# Patient Record
Sex: Male | Born: 1960 | Race: White | Hispanic: No | Marital: Married | Smoking: Never smoker
Health system: Southern US, Community
[De-identification: ages and names within clinical notes are randomized; demographics above are authoritative.]

## PROBLEM LIST (undated history)

## (undated) DIAGNOSIS — I4891 Unspecified atrial fibrillation: Secondary | ICD-10-CM

## (undated) DIAGNOSIS — M25461 Effusion, right knee: Secondary | ICD-10-CM

## (undated) DIAGNOSIS — L03115 Cellulitis of right lower limb: Secondary | ICD-10-CM

## (undated) DIAGNOSIS — M545 Low back pain, unspecified: Secondary | ICD-10-CM

## (undated) HISTORY — DX: Cellulitis of right lower limb: L03.115

## (undated) HISTORY — DX: Unspecified atrial fibrillation: I48.91

## (undated) HISTORY — DX: Low back pain, unspecified: M54.50

## (undated) HISTORY — PX: SHOULDER SURGERY: SHX246

## (undated) HISTORY — DX: Effusion, right knee: M25.461

---

## 2011-05-27 ENCOUNTER — Encounter (HOSPITAL_COMMUNITY): Payer: Self-pay | Admitting: *Deleted

## 2011-05-27 ENCOUNTER — Inpatient Hospital Stay (HOSPITAL_COMMUNITY)
Admission: EM | Admit: 2011-05-27 | Discharge: 2011-05-29 | DRG: 273 | Disposition: A | Discharge status: Home / Self Care | Payer: BC Managed Care – PPO | Source: Ambulatory Visit | Attending: Family Medicine | Admitting: Family Medicine

## 2011-05-27 DIAGNOSIS — L03211 Cellulitis of face: Secondary | ICD-10-CM

## 2011-05-27 DIAGNOSIS — B029 Zoster without complications: Principal | ICD-10-CM | POA: Diagnosis present

## 2011-05-27 LAB — DIFFERENTIAL
Basophils Absolute: 0.1 10*3/uL (ref 0.0–0.1)
Basophils Relative: 1 % (ref 0–1)
Eosinophils Absolute: 0.1 10*3/uL (ref 0.0–0.7)
Eosinophils Relative: 1 % (ref 0–5)
Lymphocytes Relative: 13 % (ref 12–46)
Lymphs Abs: 0.7 10*3/uL (ref 0.7–4.0)
Monocytes Absolute: 1.4 10*3/uL — ABNORMAL HIGH (ref 0.1–1.0)
Monocytes Relative: 25 % — ABNORMAL HIGH (ref 3–12)
Neutro Abs: 3.4 10*3/uL (ref 1.7–7.7)
Neutrophils Relative %: 61 % (ref 43–77)

## 2011-05-27 LAB — BASIC METABOLIC PANEL
BUN: 10 mg/dL (ref 6–23)
CO2: 24 mEq/L (ref 19–32)
Calcium: 9.5 mg/dL (ref 8.4–10.5)
Chloride: 104 mEq/L (ref 96–112)
Creatinine, Ser: 0.91 mg/dL (ref 0.50–1.35)
GFR calc Af Amer: >90 mL/min (ref >90)
GFR calc non Af Amer: >90 mL/min (ref >90)
Glucose, Bld: 97 mg/dL (ref 70–99)
Potassium: 4.1 mEq/L (ref 3.5–5.1)
Sodium: 139 mEq/L (ref 135–145)

## 2011-05-27 LAB — CBC
HCT: 43.3 % (ref 39.0–52.0)
Hemoglobin: 14.9 g/dL (ref 13.0–17.0)
MCH: 29.3 pg (ref 26.0–34.0)
MCHC: 34.4 g/dL (ref 30.0–36.0)
MCV: 85.2 fL (ref 78.0–100.0)
Platelets: 141 10*3/uL — ABNORMAL LOW (ref 150–400)
RBC: 5.08 MIL/uL (ref 4.22–5.81)
RDW: 12.7 % (ref 11.5–15.5)
WBC: 5.5 10*3/uL (ref 4.0–10.5)

## 2011-05-27 MED ORDER — MORPHINE SULFATE 4 MG/ML IJ SOLN
4.0000 mg | Freq: Once | INTRAMUSCULAR | Status: AC
  Administered 2011-05-27: 4 mg via INTRAVENOUS
  Filled 2011-05-27: qty 1

## 2011-05-27 MED ORDER — ACETAMINOPHEN 325 MG PO TABS
650.0000 mg | ORAL_TABLET | Freq: Once | ORAL | Status: AC
  Administered 2011-05-27: 650 mg via ORAL

## 2011-05-27 MED ORDER — IBUPROFEN 200 MG PO TABS
400.0000 mg | ORAL_TABLET | Freq: Once | ORAL | Status: AC
  Administered 2011-05-28: 400 mg via ORAL
  Filled 2011-05-27: qty 2

## 2011-05-27 MED ORDER — CLINDAMYCIN PHOSPHATE 900 MG/50ML IV SOLN
900.0000 mg | Freq: Three times a day (TID) | INTRAVENOUS | Status: DC
  Administered 2011-05-27 (×2): 900 mg via INTRAVENOUS
  Filled 2011-05-27 (×6): qty 50

## 2011-05-27 MED ORDER — SODIUM CHLORIDE 0.9 % IV SOLN
999.0000 mL | Freq: Once | INTRAVENOUS | Status: AC
  Administered 2011-05-27 (×2): 999 mL via INTRAVENOUS

## 2011-05-27 MED ORDER — ACETAMINOPHEN 325 MG PO TABS
ORAL_TABLET | ORAL | Status: AC
  Administered 2011-05-27: 23:00:00
  Filled 2011-05-27: qty 2

## 2011-05-27 NOTE — Progress Notes (Signed)
ANTIBIOTIC CONSULT NOTE - INITIAL  Pharmacy Consult for Vancomycin and Acyclovir Indication: Facial swelling poss zoster or cellulitis  No Known Allergies  Patient Measurements:  75 inches 235 lbs  Vital Signs: Temp: 99.7 F (37.6 C) (01/13 2036) Temp src: Oral (01/13 2036) BP: 151/81 mmHg (01/13 2036) Pulse Rate: 87  (01/13 2036)  Labs:  Basename 05/27/11 0902  WBC 5.5  HGB 14.9  PLT 141*  LABCREA --  CREATININE 0.91   CrCl is unknown because there is no height on file for the current visit. No results found for this basename: VANCOTROUGH:2,VANCOPEAK:2,VANCORANDOM:2,GENTTROUGH:2,GENTPEAK:2,GENTRANDOM:2,TOBRATROUGH:2,TOBRAPEAK:2,TOBRARND:2,AMIKACINPEAK:2,AMIKACINTROU:2,AMIKACIN:2, in the last 72 hours   Medical History: No past medical history on file.  Medications:  None  Assessment: 51 yo male with facial swelling for empiric antibiotics   Goal of Therapy:  Vancomycin trough 10-15  Plan:  Vancomycin 2 g IV now, then 1 g IV q8h Acyclovir 800 mg IV q8h  Eddie Candle 05/28/2011,12:01 AM

## 2011-05-27 NOTE — ED Notes (Signed)
Meal tray ordered from service response center 

## 2011-05-27 NOTE — ED Notes (Signed)
Updated the RN on 5100 and patient is now being transferred to 5123.

## 2011-05-27 NOTE — ED Notes (Signed)
Waiting for admission orders.  Called report to [phone]

## 2011-05-27 NOTE — ED Provider Notes (Signed)
Patient report received from [place], PA-C. 51 year old male presents with cellulitis on left face. No ocular involvement. Patient is in CDU under cellulitis protocol. First sets of clindamycin IV has finished. We'll start the second set of clindamycin IV around 5 PM. Anticipate discharge patient after IV antibiotic. Patient to continue previously prescribed antibiotic at home. Instructions for strict followup given.  Patient is currently in no acute distress. On evaluation, erythema noted to left face occupying approximately 2/3 of zygomatic region.  On the color crust noted.  No obvious abscess noted. Infraorbital edema noted on the left eye with no ocular involvement.  Morphine given for pain. I will continue to monitor patient care. Pt goes to [facility].    3:19 PM Sign out to [facility], PA-C  Grant Knight, [place] 05/27/11 1519

## 2011-05-27 NOTE — ED Provider Notes (Signed)
4:00 PM Patient is in CDU under observation, cellulitis protocol.  Patient with cellulitis to left face, involving left upper lip, left cheek up to the level of his lower eyelid.  Patient states his family thinks the swelling has decreased but he is unsure, feels that it is getting better and worse depending on his positioning and activity.  Denies any change in vision or pain with moving his eyes.  Declines pain medication at this time.  2nd dose of clindamycin scheduled for 5:30pm.  Will continue to follow.  On exam, pt is A&Ox4, NAD, left face with area of erythema, central induration with crusting.  EOMs intact, moving without pain.    8:00pm  Patient reevaluated by me and by Dr Ignacia Palma.  Patient is showing some improvement across his cheek but worsening towards his eye and lip.  Given worsening in the direction of the eye and question of zoster, plan is for admission.   8:48 PM Discussed patient with Dr Kaylyn Layer who will see and admit patient.  Discussed cellulitis vs herpes zoster.  Will admit for further evaluation and treatment.    Dillard Cannon [place], [place] 05/27/11 2049

## 2011-05-27 NOTE — ED Notes (Signed)
Ordered Regular Diet tray 

## 2011-05-27 NOTE — ED Provider Notes (Signed)
History     CSN: [id number]  Arrival date & time 05/27/11  0825   First MD Initiated Contact with Patient 05/27/11 [id number]      Chief Complaint  Patient presents with  . Facial Swelling    (Consider location/radiation/quality/duration/timing/severity/associated sxs/prior treatment) HPI Patient presents the emergency room with four-day history of facial swelling and redness to the left cheek and upper left lip area.  He states that he got worse yesterday and seen at an urgent care center.  He was given Rocephin IM and then placed on Bactrim DS along with Bactroban.  He was advised to come to the emergency department for any worsening in his condition.  He states that he noticed there is redness and swelling to his face and he noticed swelling under his left eye.  The patient denies fever, shortness of breath, chest pain, weakness, dysuria, nausea/vomiting, or abdominal pain.  Patient denies any difficulty swallowing or breathing.  He states he thought it was a  tooth initially but does not have any dental pain at this time. No past medical history on file.  No past surgical history on file.  No family history on file.  History  Substance Use Topics  . Smoking status: Never Smoker   . Smokeless tobacco: Not on file  . Alcohol Use: Yes      Review of Systems All pertinent positives and negatives reviewed in the history of present illness  Allergies  Review of patient's allergies indicates no known allergies.  Home Medications   Current Outpatient Rx  Name Route Sig Dispense Refill  . ACETAMINOPHEN 325 MG PO TABS Oral Take 325 mg by mouth every 6 (six) hours as needed. For pain.    . ASPIRIN 325 MG PO TABS Oral Take 325 mg by mouth as needed. For pain.    . IBUPROFEN 200 MG PO TABS Oral Take 200 mg by mouth every 8 (eight) hours as needed. For pain.    Marland Kitchen MUPIROCIN CALCIUM 2 % EX CREA Topical Apply 1 application topically 3 (three) times daily.    . SULFAMETHOXAZOLE-TMP DS  800-160 MG PO TABS Oral Take 1 tablet by mouth 2 (two) times daily.      BP 118/68  Pulse 74  Temp(Src) 98.6 F (37 C) (Oral)  Resp 20  SpO2 99%  Physical Exam  Constitutional: He is oriented to person, place, and time. He appears well-developed and well-nourished. No distress.  HENT:  Head: Normocephalic and atraumatic.  Eyes: Pupils are equal, round, and reactive to light.  Neck: Neck supple.  Cardiovascular: Normal rate, regular rhythm and normal heart sounds.   Pulmonary/Chest: Effort normal and breath sounds normal. No respiratory distress. He has no wheezes. He has no rales.  Neurological: He is alert and oriented to person, place, and time.  Skin:       Patient has redness and warmth to his left cheek with several areas that are scabbed over on his upper lip left lip area and cheek.  He does have some edema under his left eye but no pain with eye movements and the area is not firm and hot to the touch    ED Course  Procedures (including critical care time)  Labs Reviewed  CBC - Abnormal; Notable for the following:    Platelets 141 (*)    All other components within normal limits  DIFFERENTIAL - Abnormal; Notable for the following:    Monocytes Relative 25 (*)    Monocytes Absolute 1.4 (*)  All other components within normal limits  BASIC METABOLIC PANEL          MDM  Cellulitis of the left cheek.        Carlyle Dolly, PA-C 05/27/11 1443

## 2011-05-27 NOTE — ED Notes (Signed)
Clindamycin has finished infusing.

## 2011-05-27 NOTE — H&P (Signed)
PCP:  Sheila Oats, MD    Not entirely clear who exactly but is with Deboraha Sprang at  [facility] Complaint:  Facial redness, swelling, pain   HPI: 50yoM with no major medical history presents with left facial swelling and ulcerated  lesions concerning for facial zoster   Pt woke up Thursday am and noted pain in his upper lip for which he tried to see a  dentist thinking it was his teeth, but was unable to make an appt. By mid Friday he  noted swelling and redness of his left cheek, for which he sought care at Urgent Care  on Saturday, where he was given a shot of Rocephin, and sent out with Bactrim and  topical ABx. Saturday night his wife noted it to be worse, and by Sunday am his eyelid  had "puffed up" considerably, so he came to the ED.   In the ED, vitals were stable. Labs showed normal chemistry and CBC. Pt was treated in  the CDU through most of the day on 11/13, given Clinda IV x2 given here. However, he  was not felt to be improving, and in fact felt to be getting worse, with erythema  moving towards lip and eye. Then, there was some concern that this was zoster on his  face, but nobody in the ED was sure and he was not started on treatment for this.   When asked about the crusted lesions on his face, he states that these have been  painful and developed over the same time course. He is also having some pain on his  inner upper lip and above his teeth. He's never had this before. He denies any fevers,  chills, sweats, systemic illness. He denies any vision changes at all. He is not  immunocompromised. He denies any cardiopulmonary symptoms, or GI symptoms. All other  ROS negative.   No past medical history on file.  Healthy, no major medical problems  No past surgical history on file.  Medications:  HOME MEDS: Takes no medications on a regular basis  Prior to Admission medications   Medication Sig Start Date End Date Taking? Authorizing Provider    acetaminophen (TYLENOL) 325 MG tablet Take 325 mg by mouth every 6 (six) hours as needed. For pain.   Yes Historical Provider, MD  aspirin 325 MG tablet Take 325 mg by mouth as needed. For pain.   Yes Historical Provider, MD  ibuprofen (ADVIL,MOTRIN) 200 MG tablet Take 200 mg by mouth every 8 (eight) hours as needed. For pain.   Yes Historical Provider, MD  mupirocin cream (BACTROBAN) 2 % Apply 1 application topically 3 (three) times daily.   Yes Historical Provider, MD  sulfamethoxazole-trimethoprim (BACTRIM DS) 800-160 MG per tablet Take 1 tablet by mouth 2 (two) times daily.   Yes Historical Provider, MD    Allergies:  No Known Allergies  Social History:   reports that he has never smoked. He does not have any smokeless tobacco history on file. He reports that he drinks about 1.5 - 2 ounces of alcohol per week. He reports that he does not use illicit drugs. 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 Lung cancer Mother     deceased  . Diabetes Sister     deceased  . Diabetes Mother   . Breast  cancer Sister    Physical Exam: Filed Vitals:   05/27/11 0834 05/27/11 1245 05/27/11 1748 05/27/11 2036  BP: 111/69 118/68 122/78 151/81  Pulse: 71 74 74 87  Temp: 98.4 F (36.9 C) 98.6 F (37 C) 98.2 F (36.8 C) 99.7 F (37.6 C)  TempSrc: Oral Oral Oral Oral  Resp: 20   18  SpO2: 99% 99% 98% 98%   Blood pressure 151/81, pulse 87, temperature 99.7 F (37.6 C), temperature source Oral, resp. rate 18, SpO2 98.00%.  Gen: Healthy, middle aged appearing M in no distress, pleasant. Able to relate history,  appears well, non-toxic.  HEENT: Pupils round and reactive to light. Sclera have right > left moderately severe  conjuctivitis, but no other obvious keratitis or other corneal lesions, EOMI. His left  cheek from the left upper lip  extending upwards almost towards lower left eyelid is  erythematous with poorly defined borders, and quite swollen, with his left lower eyelid  very puffy and swollen. On top of this erythema adn swelling are several discrete,  round crusted lesions, largest is ~1cm, with black and yellowish crusting. Moving  superiorly, there is a smaller dry ulcerated lesion to the left of his eye, and most  concerning is that on the inner border of his lower eyelid, one finds a very small 1- 2mm ulceration. In his mouth, on the superior lip on the left, there are 1 maybe 2  small ulcerations as well, but nothing larger or more serious, rest of mouth  unremarkable.  Lungs: CTAB no w/c/r normal exam Heart: RRR, no m/g, normal exam Abd: OVerweight but soft NT ND benign Extrem: Warm, well perfusing, normal appearing in bulk and tone, no BLE edema Neuro: Alert, conversant, pleasant, moving extremities, grossly non-focal   Labs & Imaging Results for orders placed during the hospital encounter of 05/27/11 (from the past 48 hour(s))  CBC     Status: Abnormal   Collection Time   05/27/11  9:02 AM      Component Value Range Comment   WBC 5.5  4.0 - 10.5 (K/uL)    RBC 5.08  4.22 - 5.81 (MIL/uL)    Hemoglobin 14.9  13.0 - 17.0 (g/dL)    HCT 16.1  09.6 - 04.5 (%)    MCV 85.2  78.0 - 100.0 (fL)    MCH 29.3  26.0 - 34.0 (pg)    MCHC 34.4  30.0 - 36.0 (g/dL)    RDW 40.9  81.1 - 91.4 (%)    Platelets 141 (*) 150 - 400 (K/uL)   DIFFERENTIAL     Status: Abnormal   Collection Time   05/27/11  9:02 AM      Component Value Range Comment   Neutrophils Relative 61  43 - 77 (%)    Neutro Abs 3.4  1.7 - 7.7 (K/uL)    Lymphocytes Relative 13  12 - 46 (%)    Lymphs Abs 0.7  0.7 - 4.0 (K/uL)    Monocytes Relative 25 (*) 3 - 12 (%)    Monocytes Absolute 1.4 (*) 0.1 - 1.0 (K/uL)    Eosinophils Relative 1  0 - 5 (%)    Eosinophils Absolute 0.1  0.0 - 0.7 (K/uL)    Basophils Relative 1  0 - 1 (%)    Basophils Absolute 0.1   0.0 - 0.1 (K/uL)   BASIC METABOLIC PANEL     Status: Normal   Collection Time   05/27/11  9:02 AM  Component Value Range Comment   Sodium 139  135 - 145 (mEq/L)    Potassium 4.1  3.5 - 5.1 (mEq/L)    Chloride 104  96 - 112 (mEq/L)    CO2 24  19 - 32 (mEq/L)    Glucose, Bld 97  70 - 99 (mg/dL)    BUN 10  6 - 23 (mg/dL)    Creatinine, Ser 9.60  0.50 - 1.35 (mg/dL)    Calcium 9.5  8.4 - 10.5 (mg/dL)    GFR calc non Af Amer >90  >90 (mL/min)    GFR calc Af Amer >90  >90 (mL/min)    No results found.  Impression Present on Admission:  .Herpes zoster  50yoM with no major medical history presents with left facial swelling and ulcerated  lesions concerning for facial zoster  1. 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 I cannot find any evidence of eye involvement at this time,  although I am concerned about an ulcerated lesion to the left of his eye, and one on  the border of his lower eyelid. No vision changes at this time.   - IV vancomycin for possible cellulitic component and also IV acyclovir for Zoster.  - Ophtho recommends that if eye becomes involved, would recommend Zirgan (ganciclovir)  eye drops, which I have called the pharmacy and this is in stock. I told pharmacy to  keep it handy for this patient should zoster ophthalmicus be diagnosed.  - Pain control   2. No other acute medical issues.   Regular bed, [facility] team 7 Presumed full code  Other plans as per orders.  Vonnie Spagnolo 05/27/2011, 11:09 PM

## 2011-05-27 NOTE — ED Notes (Addendum)
Went to urgent care yesterday, and told he had a staph infection to the lt. Side of face. Inc. Swelling (mod.) to left side of face. Lower eye lid mod. Swelling. Told to come to the ED to be evaluated if swelling gets close to eye. Give im rocephin injection, oral bactrim, and mupirocin ointment. No visual problems.

## 2011-05-28 ENCOUNTER — Encounter (HOSPITAL_COMMUNITY): Payer: Self-pay | Admitting: Ophthalmology

## 2011-05-28 LAB — BASIC METABOLIC PANEL
BUN: 8 mg/dL (ref 6–23)
CO2: 24 mEq/L (ref 19–32)
Calcium: 9.3 mg/dL (ref 8.4–10.5)
Chloride: 105 mEq/L (ref 96–112)
Creatinine, Ser: 0.82 mg/dL (ref 0.50–1.35)
GFR calc Af Amer: >90 mL/min (ref >90)
GFR calc non Af Amer: >90 mL/min (ref >90)
Glucose, Bld: 132 mg/dL — ABNORMAL HIGH (ref 70–99)
Potassium: 4 mEq/L (ref 3.5–5.1)
Sodium: 139 mEq/L (ref 135–145)

## 2011-05-28 LAB — CBC
HCT: 41 % (ref 39.0–52.0)
Hemoglobin: 14.2 g/dL (ref 13.0–17.0)
MCH: 29.3 pg (ref 26.0–34.0)
MCHC: 34.6 g/dL (ref 30.0–36.0)
MCV: 84.7 fL (ref 78.0–100.0)
Platelets: 151 10*3/uL (ref 150–400)
RBC: 4.84 MIL/uL (ref 4.22–5.81)
RDW: 12.8 % (ref 11.5–15.5)
WBC: 5.9 10*3/uL (ref 4.0–10.5)

## 2011-05-28 MED ORDER — ACETAMINOPHEN 325 MG PO TABS
650.0000 mg | ORAL_TABLET | Freq: Four times a day (QID) | ORAL | Status: DC | PRN
  Administered 2011-05-28 – 2011-05-29 (×4): 650 mg via ORAL
  Filled 2011-05-28 (×4): qty 2

## 2011-05-28 MED ORDER — OXYCODONE HCL 5 MG PO TABS
5.0000 mg | ORAL_TABLET | ORAL | Status: DC | PRN
  Administered 2011-05-28 (×3): 5 mg via ORAL
  Filled 2011-05-28 (×3): qty 1

## 2011-05-28 MED ORDER — DEXTROSE 5 % IV SOLN
800.0000 mg | INTRAVENOUS | Status: AC
  Administered 2011-05-28: 800 mg via INTRAVENOUS
  Filled 2011-05-28: qty 16

## 2011-05-28 MED ORDER — ONDANSETRON HCL 4 MG PO TABS: 4.0000 mg | ORAL_TABLET | Freq: Four times a day (QID) | ORAL | Status: DC | PRN

## 2011-05-28 MED ORDER — VANCOMYCIN HCL IN DEXTROSE 1-5 GM/200ML-% IV SOLN
1000.0000 mg | Freq: Three times a day (TID) | INTRAVENOUS | Status: DC
  Administered 2011-05-28 – 2011-05-29 (×4): 1000 mg via INTRAVENOUS
  Filled 2011-05-28 (×8): qty 200

## 2011-05-28 MED ORDER — DEXTROSE 5 % IV SOLN
800.0000 mg | Freq: Three times a day (TID) | INTRAVENOUS | Status: DC
  Administered 2011-05-28 – 2011-05-29 (×4): 800 mg via INTRAVENOUS
  Filled 2011-05-28 (×7): qty 16

## 2011-05-28 MED ORDER — DOCUSATE SODIUM 100 MG PO CAPS
100.0000 mg | ORAL_CAPSULE | Freq: Two times a day (BID) | ORAL | Status: DC
  Administered 2011-05-28 (×2): 100 mg via ORAL
  Filled 2011-05-28 (×3): qty 1

## 2011-05-28 MED ORDER — SENNA 8.6 MG PO TABS
1.0000 | ORAL_TABLET | Freq: Two times a day (BID) | ORAL | Status: DC
  Administered 2011-05-28 (×2): 8.6 mg via ORAL
  Filled 2011-05-28 (×4): qty 1

## 2011-05-28 MED ORDER — ONDANSETRON HCL 4 MG/2ML IJ SOLN: 4.0000 mg | Freq: Four times a day (QID) | INTRAMUSCULAR | Status: DC | PRN

## 2011-05-28 MED ORDER — MORPHINE SULFATE 2 MG/ML IJ SOLN
2.0000 mg | INTRAMUSCULAR | Status: DC | PRN
  Filled 2011-05-28: qty 1

## 2011-05-28 MED ORDER — VANCOMYCIN HCL 1000 MG IV SOLR
2000.0000 mg | INTRAVENOUS | Status: AC
  Administered 2011-05-28: 2000 mg via INTRAVENOUS
  Filled 2011-05-28: qty 2000

## 2011-05-28 MED ORDER — IBUPROFEN 400 MG PO TABS
400.0000 mg | ORAL_TABLET | Freq: Four times a day (QID) | ORAL | Status: DC | PRN
  Administered 2011-05-28 – 2011-05-29 (×2): 400 mg via ORAL
  Filled 2011-05-28 (×2): qty 1

## 2011-05-28 MED ORDER — ACETAMINOPHEN 650 MG RE SUPP: 650.0000 mg | Freq: Four times a day (QID) | RECTAL | Status: DC | PRN

## 2011-05-28 MED ORDER — INFLUENZA VIRUS VACC SPLIT PF IM SUSP
0.5000 mL | INTRAMUSCULAR | Status: DC
Start: 2011-05-29 — End: 2011-05-29
  Filled 2011-05-28: qty 0.5

## 2011-05-28 NOTE — Progress Notes (Signed)
Subjective: Pt reports erythema is receeding.  States that this all started with a discomfort at his left upper lip on Thursday.  On Friday he started noticing some "spots" on his face that were uncomfortable.  He states that he initially was touching it and scratching it some.  Edema became progressively worse and patient decided to go to urgent care.  States they gave his a shot and some antibiotics.  Swelling became worse and patient decided to follow-up at the at the ED.    Objective: Filed Vitals:   05/28/11 0000 05/28/11 0028 05/28/11 0222 05/28/11 0625  BP:  132/72 144/74 141/69  Pulse:  84 75 72  Temp:  98.8 F (37.1 C) 98.2 F (36.8 C) 97.8 F (36.6 C)  TempSrc:  Oral Oral   Resp:   18 18  Height: 6\' 3"  (1.905 m)     Weight: 106.595 kg (235 lb)     SpO2:  96% 98% 99%   Weight change:   Intake/Output Summary (Last 24 hours) at 05/28/11 0905 Last data filed at 05/27/11 1840  Gross per 24 hour  Intake      0 ml  Output      0 ml  Net      0 ml    General: Alert, awake, oriented x3, in no acute distress.  HEENT: Patient has multiple crusted lesions measuring .5 cm or less with surrounding erythema at left maxillary side of face.  Pain on palpation.  No ulceration at left eye.  Heart: Regular rate and rhythm, without murmurs, rubs, gallops.  Lungs: Crackles left side, bilateral air movement.  Abdomen: Soft, nontender, nondistended, positive bowel sounds.  Neuro: Grossly intact, nonfocal.   Lab Results:  [facility] 05/27/11 0902  NA 139  K 4.1  CL 104  CO2 24  GLUCOSE 97  BUN 10  CREATININE 0.91  CALCIUM 9.5  MG --  PHOS --   No results found for this basename: AST:2,ALT:2,ALKPHOS:2,BILITOT:2,PROT:2,ALBUMIN:2 in the last 72 hours No results found for this basename: LIPASE:2,AMYLASE:2 in the last 72 hours  Basename 05/27/11 0902  WBC 5.5  NEUTROABS 3.4  HGB 14.9  HCT 43.3  MCV 85.2  PLT 141*   No results found for this basename:  CKTOTAL:3,CKMB:3,CKMBINDEX:3,TROPONINI:3 in the last 72 hours No components found with this basename: POCBNP:3 No results found for this basename: DDIMER:2 in the last 72 hours No results found for this basename: HGBA1C:2 in the last 72 hours No results found for this basename: CHOL:2,HDL:2,LDLCALC:2,TRIG:2,CHOLHDL:2,LDLDIRECT:2 in the last 72 hours No results found for this basename: TSH,T4TOTAL,FREET3,T3FREE,THYROIDAB in the last 72 hours No results found for this basename: VITAMINB12:2,FOLATE:2,FERRITIN:2,TIBC:2,IRON:2,RETICCTPCT:2 in the last 72 hours  Micro Results: No results found for this or any previous visit (from the past 240 hour(s)).  Studies/Results: No results found.  Medications: I have reviewed the patient's current medications.   Patient Active Hospital Problem List: Herpes zoster (05/27/2011) 1) At this point patient is on Acyclovir.  Would consider switching to famciclovir on discharge as it can be administered 3 times a day instead of the 5 times a day for acyclovir and thus should facilitate patient compliance.  Agree that the lesions may have superimposed bacterial infection and agree with continuing antibiotic regimen.  Will follow-up with ophthalmologies recommendations given current clinical scenario.  Would also appreciate follow-up recommendations from their standpoint.     LOS: 1 day   Penny Pia M.D.  [facility] 05/28/2011, 9:05 AM

## 2011-05-28 NOTE — ED Notes (Signed)
Report given to [place]. Pt transported to 5114

## 2011-05-28 NOTE — Progress Notes (Signed)
05/28/2011 Grant Knight [facility] Case Management Note [phone]  Utilization review completed.  

## 2011-05-28 NOTE — Consult Note (Signed)
Reason for Consult:Patient was admitted for possible cellulitis left cheek.  The patient first noted swelling and pain left cheek, left upper lip and left lower lid with redness of the left eye. Was started on Vancomycin IV for possible cellulitis, and Acyclovir IV because of the possibility of Herpes Zoster Ophthalmicus.  Referring Physician: [name] is an 51 y.o. male.  HPI: Because of the crusting lesions of the left cheek, left lower lid and redness of the left eye Herpes Zoster Ophthalmicus is suspected in the V-II distribution. Consult to decide whether topical treatment of the left eye is necessary.   No past medical history on file.  No past surgical history on file.  Family History  Problem Relation Age of Onset  . Lung cancer Mother     deceased  . Diabetes Sister     deceased  . Diabetes Mother   . Breast cancer Sister     Social History:  reports that he has never smoked. He does not have any smokeless tobacco history on file. He reports that he drinks about 1.5 - 2 ounces of alcohol per week. He reports that he does not use illicit drugs.  Allergies: No Known Allergies  Medications: I have reviewed the patient's current medications.  Results for orders placed during the hospital encounter of 05/27/11 (from the past 48 hour(s))  CBC     Status: Abnormal   Collection Time   05/27/11  9:02 AM      Component Value Range Comment   WBC 5.5  4.0 - 10.5 (K/uL)    RBC 5.08  4.22 - 5.81 (MIL/uL)    Hemoglobin 14.9  13.0 - 17.0 (g/dL)    HCT 16.1  09.6 - 04.5 (%)    MCV 85.2  78.0 - 100.0 (fL)    MCH 29.3  26.0 - 34.0 (pg)    MCHC 34.4  30.0 - 36.0 (g/dL)    RDW 40.9  81.1 - 91.4 (%)    Platelets 141 (*) 150 - 400 (K/uL)   DIFFERENTIAL     Status: Abnormal   Collection Time   05/27/11  9:02 AM      Component Value Range Comment   Neutrophils Relative 61  43 - 77 (%)    Neutro Abs 3.4  1.7 - 7.7 (K/uL)    Lymphocytes Relative 13  12 - 46 (%)    Lymphs  Abs 0.7  0.7 - 4.0 (K/uL)    Monocytes Relative 25 (*) 3 - 12 (%)    Monocytes Absolute 1.4 (*) 0.1 - 1.0 (K/uL)    Eosinophils Relative 1  0 - 5 (%)    Eosinophils Absolute 0.1  0.0 - 0.7 (K/uL)    Basophils Relative 1  0 - 1 (%)    Basophils Absolute 0.1  0.0 - 0.1 (K/uL)   BASIC METABOLIC PANEL     Status: Normal   Collection Time   05/27/11  9:02 AM      Component Value Range Comment   Sodium 139  135 - 145 (mEq/L)    Potassium 4.1  3.5 - 5.1 (mEq/L)    Chloride 104  96 - 112 (mEq/L)    CO2 24  19 - 32 (mEq/L)    Glucose, Bld 97  70 - 99 (mg/dL)    BUN 10  6 - 23 (mg/dL)    Creatinine, Ser 7.82  0.50 - 1.35 (mg/dL)    Calcium 9.5  8.4 - 10.5 (mg/dL)  GFR calc non Af Amer >90  >90 (mL/min)    GFR calc Af Amer >90  >90 (mL/min)   BASIC METABOLIC PANEL     Status: Abnormal   Collection Time   05/28/11 10:00 AM      Component Value Range Comment   Sodium 139  135 - 145 (mEq/L)    Potassium 4.0  3.5 - 5.1 (mEq/L)    Chloride 105  96 - 112 (mEq/L)    CO2 24  19 - 32 (mEq/L)    Glucose, Bld 132 (*) 70 - 99 (mg/dL)    BUN 8  6 - 23 (mg/dL)    Creatinine, Ser 9.60  0.50 - 1.35 (mg/dL)    Calcium 9.3  8.4 - 10.5 (mg/dL)    GFR calc non Af Amer >90  >90 (mL/min)    GFR calc Af Amer >90  >90 (mL/min)   CBC     Status: Normal   Collection Time   05/28/11 10:00 AM      Component Value Range Comment   WBC 5.9  4.0 - 10.5 (K/uL)    RBC 4.84  4.22 - 5.81 (MIL/uL)    Hemoglobin 14.2  13.0 - 17.0 (g/dL)    HCT 45.4  09.8 - 11.9 (%)    MCV 84.7  78.0 - 100.0 (fL)    MCH 29.3  26.0 - 34.0 (pg)    MCHC 34.6  30.0 - 36.0 (g/dL)    RDW 14.7  82.9 - 56.2 (%)    Platelets 151  150 - 400 (K/uL)     No results found.  Review of Systems  Eyes: Positive for redness. Negative for blurred vision, double vision, photophobia, pain and discharge.  Cardiovascular: Negative.   Skin: Positive for itching and rash.  All other systems reviewed and are negative.   Blood pressure 146/84, pulse  66, temperature 98.1 F (36.7 C), temperature source Oral, resp. rate 18, height 6\' 3"  (1.905 m), weight 106.595 kg (235 lb), SpO2 100.00%. Physical Exam  HENT:  Head: Normocephalic and atraumatic.  Eyes: EOM are normal. Pupils are equal, round, and reactive to light. Right eye exhibits no chemosis, no discharge, no exudate and no hordeolum. No foreign body present in the right eye. Left eye exhibits chemosis. Left eye exhibits no discharge, no exudate and no hordeolum. No foreign body present in the left eye. Right conjunctiva is not injected. Right conjunctiva has no hemorrhage. Left conjunctiva is injected. Left conjunctiva has no hemorrhage.  Slit lamp exam:      The right eye shows no corneal abrasion, no corneal flare, no corneal ulcer, no foreign body, no hyphema, no hypopyon, no fluorescein uptake and no anterior chamber bulge.       The left eye shows no corneal abrasion, no corneal flare, no corneal ulcer, no foreign body, no hyphema, no hypopyon, no fluorescein uptake and no anterior chamber bulge.      Assessment/Plan: VA: 20/30 OD  20/30 OS Near card without correction. Pupils: 3/3 RRRL No APD EOM: Full OU  SLEX:  OD: LLL nl  OS:  Left lower lid edema with 2 crusting lesions on the lid margin     Conj: nl OD  1+ injection OS     Cornea: clear OU without staining with fluorocein     AC: Deep and Quiet OU     Iris:  Normal OU     Lens:  Clear OU IOP and dilated exam delayed until some recovery so  as not to contaminate drops/equipment  As there is no evidence of corneal or conjunctival involvement with herpetic lesions(only the lower lid appears involved) and these 2 lesions appear to be crusting, there is no need at this time to treat the patient with Zyrgan gel.  The patient may develop iritis/uveitis over the next week or 2, especially if there are lesions at the tip of the nose or inside the nose.  So I have advised the patient to be aware of any worsening symptoms such as  increased redness, blurred vision, or photophobia.  And I have asked him to follow up in the office in the next 1-2 weeks for a recheck. He should continue IV then oral Acyclovir for 10 days and visit the office sooner if he notices any change or worsening of symptoms.  Joyceline Maiorino V 05/28/2011, 10:13 PM

## 2011-05-29 MED ORDER — CEPHALEXIN 500 MG PO CAPS: 500.0000 mg | ORAL_CAPSULE | Freq: Two times a day (BID) | ORAL | Status: AC

## 2011-05-29 MED ORDER — FAMCICLOVIR 500 MG PO TABS: 500.0000 mg | ORAL_TABLET | Freq: Three times a day (TID) | ORAL | Status: AC

## 2011-05-29 NOTE — Progress Notes (Signed)
Pt given dc instructions. Rn went over medications and incision/wound care. Pt verbalized understanding and had no questions regarding care of instructions.  

## 2011-05-29 NOTE — ED Provider Notes (Signed)
Medical screening examination/treatment/procedure(s) were conducted as a shared visit with non-physician practitioner(s) and myself.  I personally evaluated the patient during the encounter  See original note  Forbes Cellar, MD 05/29/11 0003

## 2011-05-29 NOTE — Discharge Summary (Signed)
Admit date: 05/27/2011 Discharge date: 05/29/2011  Primary Care Physician:  Sheila Oats, MD   Discharge Diagnoses:   No resolved problems to display.  Active Hospital Problems  Diagnoses Date Noted   . Herpes zoster 05/27/2011     Resolved Hospital Problems  Diagnoses Date Noted Date Resolved     DISCHARGE MEDICATION: Current Discharge Medication List    START taking these medications   Details  cephALEXin (KEFLEX) 500 MG capsule Take 1 capsule (500 mg total) by mouth 2 (two) times daily. Qty: 10 capsule, Refills: 0    famciclovir (FAMVIR) 500 MG tablet Take 1 tablet (500 mg total) by mouth 3 (three) times daily. Qty: 9 tablet, Refills: 0      CONTINUE these medications which have NOT CHANGED   Details  acetaminophen (TYLENOL) 325 MG tablet Take 325 mg by mouth every 6 (six) hours as needed. For pain.    aspirin 325 MG tablet Take 325 mg by mouth as needed. For pain.    mupirocin cream (BACTROBAN) 2 % Apply 1 application topically 3 (three) times daily.    sulfamethoxazole-trimethoprim (BACTRIM DS) 800-160 MG per tablet Take 1 tablet by mouth 2 (two) times daily.      STOP taking these medications     ibuprofen (ADVIL,MOTRIN) 200 MG tablet            Consults:     SIGNIFICANT DIAGNOSTIC STUDIES:  No results found.   ECHO:n/a     CARDIAC CATH & OTHER PROCEDURES:n/a  No results found for this or any previous visit (from the past 240 hour(s)).  BRIEF ADMITTING H & P: Pt is a previously healthy 51 year old that presented to the ED after developing discomfort, erythema, and edema at left cheek.  Was evaluated in the ED and reportedly patient had gone to an urgent care center before and had failed out patient antibiotic therapy.  So in the ED after evaluation was diagnosed with Herpes Zoster infection affecting V2 distribution and was placed on vancomycin and acyclovir.  Patient's condition improved on this regimen.  On discharge his WBC was WNL, patient  was afebrile, and there was noticeable regression of errythema and edema at his left cheek. Pt has been evaluated by ophthalmology and has been told that they did not think that he needed therapy for ocular involvement.  This was per patient as I did not see any notes left by opthalmology.  Patient is to follow-up with his PCP in 2-7 days and his ophthalmologist in 10 days.  Patient is aware and agreeable.  No resolved problems to display.  Active Hospital Problems  Diagnoses Date Noted   . Herpes zoster 05/27/2011     Resolved Hospital Problems  Diagnoses Date Noted Date Resolved     Disposition and Follow-up: With pcp in 2-5 days and with ophthalmologist in 10 days.  Follow-up Information    Follow up with [facility]. (If symptoms worsen)    Contact information:   [address] [place] [address] [phone]      Follow up with [facility] .          DISCHARGE EXAM:   General: Alert, awake, oriented x3, in no acute distress.  HEENT: Patient has multiple crusted lesions measuring .5 cm or less with surrounding erythema at left maxillary side of face. Pain on palpation. No ulceration at left eye.  Heart: Regular rate and rhythm, without murmurs, rubs, gallops.  Lungs: Crackles left side, bilateral air movement.  Abdomen:  Soft, nontender, nondistended, positive bowel sounds.  Neuro: Grossly intact, nonfocal.     Blood pressure 144/86, pulse 66, temperature 97.8 F (36.6 C), temperature source Oral, resp. rate 18, height 6\' 3"  (1.905 m), weight 106.595 kg (235 lb), SpO2 97.00%.   Basename 05/28/11 1000 05/27/11 0902  NA 139 139  K 4.0 4.1  CL 105 104  CO2 24 24  GLUCOSE 132* 97  BUN 8 10  CREATININE 0.82 0.91  CALCIUM 9.3 9.5  MG -- --  PHOS -- --   No results found for this basename: AST:2,ALT:2,ALKPHOS:2,BILITOT:2,PROT:2,ALBUMIN:2 in the last 72 hours No results found for this basename: LIPASE:2,AMYLASE:2 in the last 72  hours  Basename 05/28/11 1000 05/27/11 0902  WBC 5.9 5.5  NEUTROABS -- 3.4  HGB 14.2 14.9  HCT 41.0 43.3  MCV 84.7 85.2  PLT 151 141*    Signed: Penny Pia M.D. 05/29/2011, 1:29 PM  Total time spent 35 min  Discussing with patient plan of care and further recommendations as well as witting discharge summary and placing orders.

## 2011-05-29 NOTE — ED Provider Notes (Signed)
Medical screening examination/treatment/procedure(s) were performed by non-physician practitioner and as supervising physician I was immediately available for consultation/collaboration.  See original note  Forbes Cellar, MD 05/29/11 0003

## 2011-05-29 NOTE — ED Provider Notes (Signed)
Medical screening examination/treatment/procedure(s) were conducted as a shared visit with non-physician practitioner(s) and myself.  I personally evaluated the patient during the encounter  Lt facial cellulitis v/s atypical zoster. LT TM and ear canal unremarkable. Not c/o visual pain or deficits. Hold prednisone and acyclovir for now. Transferred to CDU for cellulitis protocol.  Forbes Cellar, MD 05/29/11 0002

## 2012-02-16 ENCOUNTER — Emergency Department (HOSPITAL_COMMUNITY)
Admission: EM | Admit: 2012-02-16 | Discharge: 2012-02-16 | Disposition: A | Discharge status: Home / Self Care | Payer: BC Managed Care – PPO | Attending: Emergency Medicine | Admitting: Emergency Medicine

## 2012-02-16 ENCOUNTER — Emergency Department (HOSPITAL_COMMUNITY): Payer: BC Managed Care – PPO

## 2012-02-16 ENCOUNTER — Encounter (HOSPITAL_COMMUNITY): Payer: Self-pay | Admitting: Emergency Medicine

## 2012-02-16 DIAGNOSIS — K5730 Diverticulosis of large intestine without perforation or abscess without bleeding: Secondary | ICD-10-CM | POA: Insufficient documentation

## 2012-02-16 DIAGNOSIS — R509 Fever, unspecified: Secondary | ICD-10-CM | POA: Insufficient documentation

## 2012-02-16 DIAGNOSIS — R110 Nausea: Secondary | ICD-10-CM | POA: Insufficient documentation

## 2012-02-16 DIAGNOSIS — M545 Low back pain, unspecified: Secondary | ICD-10-CM | POA: Insufficient documentation

## 2012-02-16 LAB — URINALYSIS, ROUTINE W REFLEX MICROSCOPIC
Glucose, UA: NEGATIVE mg/dL
Hgb urine dipstick: NEGATIVE
Ketones, ur: 15 mg/dL — AB
Nitrite: NEGATIVE
Protein, ur: NEGATIVE mg/dL
Specific Gravity, Urine: 1.025 (ref 1.005–1.030)
Urobilinogen, UA: 1 mg/dL (ref 0.0–1.0)
pH: 6.5 (ref 5.0–8.0)

## 2012-02-16 LAB — CBC WITH DIFFERENTIAL/PLATELET
Basophils Absolute: 0 10*3/uL (ref 0.0–0.1)
Basophils Relative: 0 % (ref 0–1)
Eosinophils Absolute: 0.1 10*3/uL (ref 0.0–0.7)
Eosinophils Relative: 0 % (ref 0–5)
HCT: 41.5 % (ref 39.0–52.0)
Hemoglobin: 14.4 g/dL (ref 13.0–17.0)
Lymphocytes Relative: 6 % — ABNORMAL LOW (ref 12–46)
Lymphs Abs: 0.8 10*3/uL (ref 0.7–4.0)
MCH: 29.7 pg (ref 26.0–34.0)
MCHC: 34.7 g/dL (ref 30.0–36.0)
MCV: 85.6 fL (ref 78.0–100.0)
Monocytes Absolute: 0.9 10*3/uL (ref 0.1–1.0)
Monocytes Relative: 7 % (ref 3–12)
Neutro Abs: 10.6 10*3/uL — ABNORMAL HIGH (ref 1.7–7.7)
Neutrophils Relative %: 86 % — ABNORMAL HIGH (ref 43–77)
Platelets: 192 10*3/uL (ref 150–400)
RBC: 4.85 MIL/uL (ref 4.22–5.81)
RDW: 12.3 % (ref 11.5–15.5)
WBC: 12.3 10*3/uL — ABNORMAL HIGH (ref 4.0–10.5)

## 2012-02-16 LAB — BASIC METABOLIC PANEL
BUN: 14 mg/dL (ref 6–23)
CO2: 25 mEq/L (ref 19–32)
Calcium: 9.5 mg/dL (ref 8.4–10.5)
Chloride: 98 mEq/L (ref 96–112)
Creatinine, Ser: 0.89 mg/dL (ref 0.50–1.35)
GFR calc Af Amer: >90 mL/min (ref >90)
GFR calc non Af Amer: >90 mL/min (ref >90)
Glucose, Bld: 100 mg/dL — ABNORMAL HIGH (ref 70–99)
Potassium: 3.7 mEq/L (ref 3.5–5.1)
Sodium: 132 mEq/L — ABNORMAL LOW (ref 135–145)

## 2012-02-16 LAB — URINE MICROSCOPIC-ADD ON

## 2012-02-16 MED ORDER — CIPROFLOXACIN HCL 500 MG PO TABS: 500.0000 mg | ORAL_TABLET | Freq: Two times a day (BID) | ORAL | Status: DC

## 2012-02-16 MED ORDER — OXYCODONE-ACETAMINOPHEN 5-325 MG PO TABS
1.0000 | ORAL_TABLET | Freq: Once | ORAL | Status: AC
  Administered 2012-02-16: 1 via ORAL
  Filled 2012-02-16: qty 1

## 2012-02-16 MED ORDER — KETOROLAC TROMETHAMINE 30 MG/ML IJ SOLN
30.0000 mg | Freq: Once | INTRAMUSCULAR | Status: AC
  Administered 2012-02-16: 30 mg via INTRAVENOUS
  Filled 2012-02-16: qty 1

## 2012-02-16 MED ORDER — OXYCODONE-ACETAMINOPHEN 5-325 MG PO TABS: 2.0000 | ORAL_TABLET | ORAL | Status: DC | PRN

## 2012-02-16 MED ORDER — ONDANSETRON HCL 4 MG/2ML IJ SOLN
4.0000 mg | Freq: Once | INTRAMUSCULAR | Status: AC
  Administered 2012-02-16: 4 mg via INTRAVENOUS
  Filled 2012-02-16: qty 2

## 2012-02-16 MED ORDER — DIAZEPAM 5 MG PO TABS: 5.0000 mg | ORAL_TABLET | Freq: Four times a day (QID) | ORAL | Status: DC | PRN

## 2012-02-16 MED ORDER — IBUPROFEN 800 MG PO TABS: 800.0000 mg | ORAL_TABLET | Freq: Three times a day (TID) | ORAL | Status: DC

## 2012-02-16 MED ORDER — ONDANSETRON HCL 4 MG PO TABS: 4.0000 mg | ORAL_TABLET | Freq: Four times a day (QID) | ORAL | Status: DC

## 2012-02-16 MED ORDER — SODIUM CHLORIDE 0.9 % IV BOLUS (SEPSIS)
1000.0000 mL | Freq: Once | INTRAVENOUS | Status: AC
  Administered 2012-02-16: 1000 mL via INTRAVENOUS

## 2012-02-16 NOTE — ED Notes (Signed)
Pt reports left low back pain onset Wednesday morning. Pt reports pain increased today about 1 hour ago. Pt denies urinary symptoms or recent injury.

## 2012-02-16 NOTE — ED Notes (Signed)
Patient stated that he was lifting something at home on Wednesday when his back started hurting. Since then he has had back pain. Pain increased this afternoon after he moved his son's back. Pain was 9 out of 10. However, after Percocet in the Emergency Department his pain is now 1.5. Pain is in the middle of spine in lower back. No radiation of pain. No blood in urine. No numbness or tingling. Will continue to monitor.

## 2012-02-16 NOTE — ED Provider Notes (Signed)
History     CSN: [id number]  Arrival date & time 02/16/12  1804   First MD Initiated Contact with Patient 02/16/12 1923      Chief Complaint  Patient presents with  . Back Pain    left side    (Consider location/radiation/quality/duration/timing/severity/associated sxs/prior treatment) HPI Comments: Patient complains of left paraspinal low back pain for the past 4 days. Started after he was sleeping on the couch. It has been intermittent coming going without radiation. He got acutely worse tonight when he was reaching for a bike. Is associated with diaphoresis and nausea. It is sharp and stabbing at bedtime. Does not radiate. No weakness, numbness, tingling, bowel bladder incontinence, fever or vomiting. No dysuria or hematuria. No testicular pain. No history of cancer or IV drug abuse.   The history is provided by the patient.    History reviewed. No pertinent past medical history.  Past Surgical History  Procedure Date  . Shoulder surgery     Family History  Problem Relation Age of Onset  . Lung cancer Mother     deceased  . Diabetes Sister     deceased  . Diabetes Mother   . Breast cancer Sister     History  Substance Use Topics  . Smoking status: Never Smoker   . Smokeless tobacco: Not on file  . Alcohol Use: 1.5 - 2.0 oz/week    3-4 drink(s) per week      Review of Systems  Constitutional: Negative for fever, activity change and appetite change.  HENT: Negative for congestion and rhinorrhea.   Respiratory: Negative for cough, chest tightness and shortness of breath.   Cardiovascular: Negative for chest pain.  Gastrointestinal: Positive for nausea. Negative for vomiting and abdominal pain.  Genitourinary: Negative for dysuria, hematuria and difficulty urinating.  Musculoskeletal: Positive for back pain.  Skin: Negative for rash.  Neurological: Negative for dizziness, weakness and headaches.    Allergies  Review of patient's allergies indicates no  known allergies.  Home Medications   Current Outpatient Rx  Name Route Sig Dispense Refill  . OVER THE COUNTER MEDICATION Oral Take 2 capsules by mouth 2 (two) times daily. Medication: JUICE PLUS=2 GARDEN BLEND CAPSULES & 2 ORCHARD CAPSULES    . CIPROFLOXACIN HCL 500 MG PO TABS Oral Take 1 tablet (500 mg total) by mouth every 12 (twelve) hours. 10 tablet 0  . DIAZEPAM 5 MG PO TABS Oral Take 1 tablet (5 mg total) by mouth every 6 (six) hours as needed for anxiety. 6 tablet 0  . IBUPROFEN 800 MG PO TABS Oral Take 1 tablet (800 mg total) by mouth 3 (three) times daily. 21 tablet 0  . ONDANSETRON HCL 4 MG PO TABS Oral Take 1 tablet (4 mg total) by mouth every 6 (six) hours. 12 tablet 0  . OXYCODONE-ACETAMINOPHEN 5-325 MG PO TABS Oral Take 2 tablets by mouth every 4 (four) hours as needed for pain. 15 tablet 0    BP 120/73  Pulse 66  Temp 97.6 F (36.4 C) (Oral)  Resp 16  SpO2 100%  Physical Exam  Constitutional: He is oriented to person, place, and time. He appears well-developed and well-nourished. No distress.  HENT:  Head: Normocephalic and atraumatic.  Mouth/Throat: Oropharynx is clear and moist. No oropharyngeal exudate.  Eyes: Conjunctivae normal are normal. Pupils are equal, round, and reactive to light.  Neck: Normal range of motion. Neck supple.  Cardiovascular: Normal rate, regular rhythm and normal heart sounds.   Pulmonary/Chest: Effort  normal and breath sounds normal. No respiratory distress.  Abdominal: Soft. There is no tenderness. There is no rebound and no guarding.  Musculoskeletal: Normal range of motion. He exhibits no edema and no tenderness.       Left paraspinal lumbar tenderness, no midline tenderness  Neurological: He is alert and oriented to person, place, and time. He has normal reflexes. No cranial nerve deficit.       5/5 strength in bilateral lower extremities. Ankle plantar and dorsiflexion intact. Great toe extension intact bilaterally. +2 DP and PT  pulses. +2 patellar reflexes bilaterally. Normal gait.   Skin: Skin is warm.    ED Course  Procedures (including critical care time)  Labs Reviewed  URINALYSIS, ROUTINE W REFLEX MICROSCOPIC - Abnormal; Notable for the following:    Color, Urine AMBER (*)  BIOCHEMICALS MAY BE AFFECTED BY COLOR   Bilirubin Urine SMALL (*)     Ketones, ur 15 (*)     Leukocytes, UA TRACE (*)     All other components within normal limits  CBC WITH DIFFERENTIAL - Abnormal; Notable for the following:    WBC 12.3 (*)     Neutrophils Relative 86 (*)     Neutro Abs 10.6 (*)     Lymphocytes Relative 6 (*)     All other components within normal limits  BASIC METABOLIC PANEL - Abnormal; Notable for the following:    Sodium 132 (*)     Glucose, Bld 100 (*)     All other components within normal limits  URINE MICROSCOPIC-ADD ON - Abnormal; Notable for the following:    Bacteria, UA FEW (*)     All other components within normal limits  URINE CULTURE   Ct Abdomen Pelvis Wo Contrast  02/16/2012  *RADIOLOGY REPORT*  Clinical Data: Left low back pain  CT ABDOMEN AND PELVIS WITHOUT CONTRAST  Technique:  Multidetector CT imaging of the abdomen and pelvis was performed following the standard protocol without intravenous contrast.  Comparison: None.  Findings: Visualized lung bases clear.  Unremarkable uninfused evaluation of liver, nondistended gallbladder, spleen, adrenal glands, pancreas, kidneys.  No nephrolithiasis or hydronephrosis. Scattered aortoiliac arterial plaque without aneurysm.  Stomach and small bowel are nondistended.  Normal appendix.  The colon is nondilated with a few scattered distal diverticula; no adjacent inflammatory/edematous change.  Urinary bladder is incompletely distended.  Mild prostatic prominence with central coarse calcifications.  Regional bones unremarkable.  No ascites.  No free air.  No adenopathy.  Disc protrusions L4-5, L5-S1.  IMPRESSION:  1.  Negative for nephrolithiasis,  hydronephrosis, or other acute abnormality. 2.  A few scattered colonic diverticula without CT evidence of diverticulitis or abscess.   Original Report Authenticated By: Osa Craver, M.D.    Dg Chest 2 View  02/16/2012  *RADIOLOGY REPORT*  Clinical Data: Fever.  CHEST - 2 VIEW  Comparison: None  Findings: Mild hyperinflation. Heart and mediastinal contours are within normal limits.  No focal opacities or effusions.  No acute bony abnormality.  IMPRESSION: No active cardiopulmonary disease.   Original Report Authenticated By: Cyndie Chime, M.D.      1. Lumbar back pain       MDM  Left paraspinal low back pain without radiation. No weakness, numbness or tingling. No bowel or bladder incontinence.  leukocytosis noted. There is a few bacteria in urinalysis without overt infection. No hematuria. No focal weakness. No red flags. No incontinence.   Perispinal pain appears to be musculoskeletal. It is  worse with certain positions. He is not have any neurological deficits. Urinalysis is benign the we'll sent culture. No evidence of kidney stone and that he may have recently passed one. We'll discharge with symptomatic treatment and PCP followup.      Glynn Octave, MD 02/16/12 [phone]

## 2012-02-18 LAB — URINE CULTURE
Colony Count: NO GROWTH
Culture: NO GROWTH

## 2012-03-11 ENCOUNTER — Other Ambulatory Visit: Payer: Self-pay | Admitting: Family Medicine

## 2012-03-11 DIAGNOSIS — R140 Abdominal distension (gaseous): Secondary | ICD-10-CM

## 2012-03-19 ENCOUNTER — Ambulatory Visit
Admission: RE | Admit: 2012-03-19 | Discharge: 2012-03-19 | Disposition: A | Discharge status: Home / Self Care | Payer: BC Managed Care – PPO | Source: Ambulatory Visit | Attending: Family Medicine | Admitting: Family Medicine

## 2012-03-19 DIAGNOSIS — R140 Abdominal distension (gaseous): Secondary | ICD-10-CM

## 2012-03-19 MED ORDER — IOHEXOL 300 MG/ML  SOLN
125.0000 mL | Freq: Once | INTRAMUSCULAR | Status: AC | PRN
  Administered 2012-03-19: 125 mL via INTRAVENOUS

## 2014-01-15 ENCOUNTER — Other Ambulatory Visit: Payer: Self-pay | Admitting: Family Medicine

## 2014-01-15 DIAGNOSIS — R1011 Right upper quadrant pain: Secondary | ICD-10-CM

## 2014-01-27 ENCOUNTER — Other Ambulatory Visit: Payer: BC Managed Care – PPO

## 2016-06-18 DIAGNOSIS — R05 Cough: Secondary | ICD-10-CM | POA: Diagnosis not present

## 2016-06-18 DIAGNOSIS — J111 Influenza due to unidentified influenza virus with other respiratory manifestations: Secondary | ICD-10-CM | POA: Diagnosis not present

## 2016-06-20 DIAGNOSIS — J101 Influenza due to other identified influenza virus with other respiratory manifestations: Secondary | ICD-10-CM | POA: Diagnosis not present

## 2017-07-30 DIAGNOSIS — H524 Presbyopia: Secondary | ICD-10-CM | POA: Diagnosis not present

## 2017-10-09 DIAGNOSIS — B351 Tinea unguium: Secondary | ICD-10-CM | POA: Diagnosis not present

## 2017-10-09 DIAGNOSIS — B353 Tinea pedis: Secondary | ICD-10-CM | POA: Diagnosis not present

## 2017-10-25 DIAGNOSIS — L309 Dermatitis, unspecified: Secondary | ICD-10-CM | POA: Diagnosis not present

## 2017-10-27 ENCOUNTER — Emergency Department (HOSPITAL_BASED_OUTPATIENT_CLINIC_OR_DEPARTMENT_OTHER)
Admission: EM | Admit: 2017-10-27 | Discharge: 2017-10-27 | Disposition: A | Discharge status: Home / Self Care | Payer: BLUE CROSS/BLUE SHIELD | Attending: Emergency Medicine | Admitting: Emergency Medicine

## 2017-10-27 ENCOUNTER — Other Ambulatory Visit: Payer: Self-pay

## 2017-10-27 ENCOUNTER — Encounter (HOSPITAL_BASED_OUTPATIENT_CLINIC_OR_DEPARTMENT_OTHER): Payer: Self-pay | Admitting: Emergency Medicine

## 2017-10-27 ENCOUNTER — Emergency Department (HOSPITAL_BASED_OUTPATIENT_CLINIC_OR_DEPARTMENT_OTHER): Payer: BLUE CROSS/BLUE SHIELD

## 2017-10-27 DIAGNOSIS — M25561 Pain in right knee: Secondary | ICD-10-CM | POA: Diagnosis not present

## 2017-10-27 DIAGNOSIS — Z79899 Other long term (current) drug therapy: Secondary | ICD-10-CM | POA: Diagnosis not present

## 2017-10-27 DIAGNOSIS — M25461 Effusion, right knee: Secondary | ICD-10-CM | POA: Diagnosis not present

## 2017-10-27 NOTE — ED Provider Notes (Signed)
[facility] EMERGENCY DEPARTMENT Provider Note   CSN: [id number] Arrival date & time: 10/27/17  1412     History   Chief Complaint Chief Complaint  Patient presents with  . Knee Pain    HPI Grant Knight is a 57 y.o. male.  HPI  57 year old male presents with right knee pain.  He states that originally started about 2 weeks ago.  He thinks this was from straining his knee while pressure washing the house and he was standing on his driveway at a weird angle and might of added stress to his knee.  The seem to slowly improve over several days.  Then about a week ago he was moving furniture and thinks he twisted his knee repetitively and next day noticed some more pain to the same knee.  Is also seem to slightly improve until the last 48 hours it seems to be a little bit worse.  He has also noticed some swelling.  However he has not noticed fever, redness or warmth/heat.  There is some pain with range of motion but no decreased range of motion.  He has not tried anything specific for pain or discomfort. No history of gout. It usually hurts worst when he first gets up but then improves as he walks. No pain at rest.  History reviewed. No pertinent past medical history.  Patient Active Problem List   Diagnosis Date Noted  . Herpes zoster 05/27/2011    Past Surgical History:  Procedure Laterality Date  . SHOULDER SURGERY          Home Medications    Prior to Admission medications   Medication Sig Start Date End Date Taking? Authorizing Provider  ciprofloxacin (CIPRO) 500 MG tablet Take 1 tablet (500 mg total) by mouth every 12 (twelve) hours. 02/16/12   Rancour, Jeannett Senior, MD  diazepam (VALIUM) 5 MG tablet Take 1 tablet (5 mg total) by mouth every 6 (six) hours as needed for anxiety. 02/16/12   Rancour, Jeannett Senior, MD  ibuprofen (ADVIL,MOTRIN) 800 MG tablet Take 1 tablet (800 mg total) by mouth 3 (three) times daily. 02/16/12   Rancour, Jeannett Senior, MD  ondansetron (ZOFRAN) 4 MG tablet  Take 1 tablet (4 mg total) by mouth every 6 (six) hours. 02/16/12   Rancour, Jeannett Senior, MD  OVER THE COUNTER MEDICATION Take 2 capsules by mouth 2 (two) times daily. Medication: JUICE PLUS=2 GARDEN BLEND CAPSULES & 2 ORCHARD CAPSULES    [provider]  oxyCODONE-acetaminophen (PERCOCET/ROXICET) 5-325 MG per tablet Take 2 tablets by mouth every 4 (four) hours as needed for pain. 02/16/12   Glynn Octave, MD    Family History Family History  Problem Relation Age of Onset  . Lung cancer Mother        deceased  . Diabetes Mother   . Diabetes Sister        deceased  . Breast cancer Sister     Social History Social History   Tobacco Use  . Smoking status: Never Smoker  . Smokeless tobacco: Never Used  Substance Use Topics  . Alcohol use: Yes    Alcohol/week: 1.8 - 2.4 oz    Types: 3 - 4 Standard drinks or equivalent per week  . Drug use: No     Allergies   Patient has no known allergies.   Review of Systems Review of Systems  Constitutional: Negative for fever.  Musculoskeletal: Positive for arthralgias and joint swelling.  Skin: Negative for color change.  Neurological: Negative for weakness and numbness.  Physical Exam Updated Vital Signs BP (!) 159/87 (BP Location: Left Arm)   Pulse 71   Temp 98.4 F (36.9 C) (Oral)   Resp 16   Ht 6\' 2"  (1.88 m)   Wt 104.3 kg (230 lb)   SpO2 99%   BMI 29.53 kg/m   Physical Exam  Constitutional: He appears well-developed and well-nourished. No distress.  HENT:  Head: Normocephalic and atraumatic.  Eyes: Right eye exhibits no discharge. Left eye exhibits no discharge.  Cardiovascular: Normal rate and regular rhythm.  Pulses:      Dorsalis pedis pulses are 2+ on the right side.  Pulmonary/Chest: Effort normal.  Musculoskeletal: He exhibits no edema.       Right knee: He exhibits swelling and effusion (mild). He exhibits normal range of motion, no erythema, normal alignment, no LCL laxity, no bony tenderness and  no MCL laxity. No tenderness found.  Normal strength and sensation in RLE. Normal active/passive ROM. Mild knee effusion, minimal warmth but no erythema or heat.  Neurological: He is alert.  Skin: Skin is warm and dry. He is not diaphoretic. No erythema.  Nursing note and vitals reviewed.    ED Treatments / Results  Labs (all labs ordered are listed, but only abnormal results are displayed) Labs Reviewed - No data to display  EKG None  Radiology Dg Knee Complete 4 Views Right  Result Date: 10/27/2017 CLINICAL DATA:  Right knee pain and swelling for 2 weeks. EXAM: RIGHT KNEE - COMPLETE 4+ VIEW COMPARISON:  None. FINDINGS: There is a moderate suprapatellar joint effusion. None no underlying fracture or dislocation identified. No significant arthropathy. IMPRESSION: 1. Suprapatellar joint effusion. Electronically Signed   By: Signa Kellaylor  Stroud M.D.   On: 10/27/2017 14:46    Procedures Procedures (including critical care time)  Medications Ordered in ED Medications - No data to display   Initial Impression / Assessment and Plan / ED Course  I have reviewed the triage vital signs and the nursing notes.  Pertinent labs & imaging results that were available during my care of the patient were reviewed by me and considered in my medical decision making (see chart for details).     The patient likely has had some minimal spraining it to his knee causing an inflammatory response.  This has caused a mild joint effusion.  My suspicion for septic arthritis is low.  He is afebrile with no erythema or heat to the joint.  No decreased range of motion.  I offered diagnostic/therapeutic arthrocentesis.  After discussion of risks/benefits and pros/cons he declines it would rather try NSAIDs and other conservative therapy first.  Given low suspicion for septic joint I think this is reasonable.  We did discuss strict return precautions however.   Final Clinical Impressions(s) / ED Diagnoses   Final  diagnoses:  Effusion of right knee    ED Discharge Orders    None       Pricilla LovelessGoldston, Marcanthony Sleight, MD 10/27/17 [phone]

## 2017-10-27 NOTE — Discharge Instructions (Signed)
If you develop worsening knee pain, inability to fully bend or straighten her knee, redness or warmth/heat to the knee, fever, or any other new/concerning symptoms return to the ER for evaluation.  Otherwise start taking an anti-inflammatory such as ibuprofen for the pain and discomfort.  Be sure to ice your knee.

## 2017-10-27 NOTE — ED Triage Notes (Signed)
R knee pain x 2 weeks, denies injury.

## 2017-11-22 DIAGNOSIS — B351 Tinea unguium: Secondary | ICD-10-CM | POA: Diagnosis not present

## 2017-11-22 DIAGNOSIS — L309 Dermatitis, unspecified: Secondary | ICD-10-CM | POA: Diagnosis not present

## 2017-12-24 DIAGNOSIS — B351 Tinea unguium: Secondary | ICD-10-CM | POA: Diagnosis not present

## 2017-12-26 DIAGNOSIS — B351 Tinea unguium: Secondary | ICD-10-CM | POA: Diagnosis not present

## 2018-03-11 DIAGNOSIS — L03115 Cellulitis of right lower limb: Secondary | ICD-10-CM | POA: Diagnosis not present

## 2018-03-12 DIAGNOSIS — L0390 Cellulitis, unspecified: Secondary | ICD-10-CM | POA: Diagnosis not present

## 2018-03-13 DIAGNOSIS — L03116 Cellulitis of left lower limb: Secondary | ICD-10-CM | POA: Diagnosis not present

## 2018-03-15 DIAGNOSIS — L0390 Cellulitis, unspecified: Secondary | ICD-10-CM | POA: Diagnosis not present

## 2018-03-18 ENCOUNTER — Other Ambulatory Visit: Payer: Self-pay

## 2018-03-18 ENCOUNTER — Encounter (HOSPITAL_BASED_OUTPATIENT_CLINIC_OR_DEPARTMENT_OTHER): Payer: Self-pay | Admitting: *Deleted

## 2018-03-18 ENCOUNTER — Emergency Department (HOSPITAL_BASED_OUTPATIENT_CLINIC_OR_DEPARTMENT_OTHER): Payer: BLUE CROSS/BLUE SHIELD

## 2018-03-18 ENCOUNTER — Emergency Department (HOSPITAL_BASED_OUTPATIENT_CLINIC_OR_DEPARTMENT_OTHER)
Admission: EM | Admit: 2018-03-18 | Discharge: 2018-03-18 | Disposition: A | Discharge status: Home / Self Care | Payer: BLUE CROSS/BLUE SHIELD | Attending: Emergency Medicine | Admitting: Emergency Medicine

## 2018-03-18 DIAGNOSIS — R600 Localized edema: Secondary | ICD-10-CM | POA: Diagnosis not present

## 2018-03-18 DIAGNOSIS — M79604 Pain in right leg: Secondary | ICD-10-CM | POA: Diagnosis present

## 2018-03-18 DIAGNOSIS — R2241 Localized swelling, mass and lump, right lower limb: Secondary | ICD-10-CM | POA: Insufficient documentation

## 2018-03-18 DIAGNOSIS — L03115 Cellulitis of right lower limb: Secondary | ICD-10-CM | POA: Diagnosis not present

## 2018-03-18 DIAGNOSIS — L02419 Cutaneous abscess of limb, unspecified: Secondary | ICD-10-CM | POA: Diagnosis not present

## 2018-03-18 NOTE — ED Provider Notes (Signed)
I received this patient in signout from Dr. Adela Lank.  He was recently diagnosed with right leg cellulitis, on antibiotics currently, and was sent here for rule out DVT due to ongoing swelling and redness.  Ultrasound was negative for DVT.  Patient well-appearing and comfortable on reassessment.  Educated on supportive measures for his peripheral edema including elevation of leg as often as possible, avoidance of long periods of walking, and compression stocking if tolerated.  Instructed to follow-up with PCP.  Patient voiced understanding of plan.   Grant Knight, Ambrose Finland, MD 03/18/18 [phone]

## 2018-03-18 NOTE — ED Notes (Signed)
Patient transported to Ultrasound 

## 2018-03-18 NOTE — ED Provider Notes (Signed)
[facility] EMERGENCY DEPARTMENT Provider Note   CSN: [id number] Arrival date & time: 03/18/18  1355     History   Chief Complaint Chief Complaint  Patient presents with  . Leg Pain    HPI Grant Knight is a 57 y.o. male.  57 yo M with a cc of right leg pain and swelling.  Going on for the past two week.  He had a small break in the skin to the medial aspect of the right lower leg he thought this was may be a spider bite.  Had increasing redness and swelling since.  Had gotten a dose of Rocephin in the office and was started on Bactrim with significant improvement but not resolution of his symptoms. Was then switched to clinda, and reevaluated again without resolution of symptoms, sent here for DVT study.   Denies prior PE or DVT, denies chest pain. Denies hx of cancer, prolonged immobilization, surgery, hospitalization.  Denies testosterone use.   The history is provided by the patient.  Leg Pain   This is a new problem. The current episode started yesterday. The problem occurs constantly. The problem has not changed since onset.The pain is present in the right lower leg. The quality of the pain is described as aching. The pain is at a severity of 5/10. The pain is mild. Pertinent negatives include no numbness. He has tried nothing for the symptoms. The treatment provided no relief.    History reviewed. No pertinent past medical history.  Patient Active Problem List   Diagnosis Date Noted  . Herpes zoster 05/27/2011    Past Surgical History:  Procedure Laterality Date  . SHOULDER SURGERY          Home Medications    Prior to Admission medications   Medication Sig Start Date End Date Taking? Authorizing Provider  CLINDAMYCIN HCL PO Take by mouth.   Yes [provider]  ciprofloxacin (CIPRO) 500 MG tablet Take 1 tablet (500 mg total) by mouth every 12 (twelve) hours. 02/16/12   Rancour, Jeannett Senior, MD  diazepam (VALIUM) 5 MG tablet Take 1 tablet (5 mg  total) by mouth every 6 (six) hours as needed for anxiety. 02/16/12   Rancour, Jeannett Senior, MD  ibuprofen (ADVIL,MOTRIN) 800 MG tablet Take 1 tablet (800 mg total) by mouth 3 (three) times daily. 02/16/12   Rancour, Jeannett Senior, MD  ondansetron (ZOFRAN) 4 MG tablet Take 1 tablet (4 mg total) by mouth every 6 (six) hours. 02/16/12   Rancour, Jeannett Senior, MD  OVER THE COUNTER MEDICATION Take 2 capsules by mouth 2 (two) times daily. Medication: JUICE PLUS=2 GARDEN BLEND CAPSULES & 2 ORCHARD CAPSULES    [provider]  oxyCODONE-acetaminophen (PERCOCET/ROXICET) 5-325 MG per tablet Take 2 tablets by mouth every 4 (four) hours as needed for pain. 02/16/12   Glynn Octave, MD    Family History Family History  Problem Relation Age of Onset  . Lung cancer Mother        deceased  . Diabetes Mother   . Diabetes Sister        deceased  . Breast cancer Sister     Social History Social History   Tobacco Use  . Smoking status: Never Smoker  . Smokeless tobacco: Never Used  Substance Use Topics  . Alcohol use: Yes    Alcohol/week: 3.0 - 4.0 standard drinks    Types: 3 - 4 Standard drinks or equivalent per week  . Drug use: No     Allergies  Patient has no known allergies.   Review of Systems Review of Systems  Constitutional: Positive for fever (a week or so ago). Negative for chills.  HENT: Negative for congestion and facial swelling.   Eyes: Negative for discharge and visual disturbance.  Respiratory: Negative for shortness of breath.   Cardiovascular: Positive for leg swelling. Negative for chest pain and palpitations.  Gastrointestinal: Negative for abdominal pain, diarrhea and vomiting.  Musculoskeletal: Negative for arthralgias and myalgias.  Skin: Negative for color change and rash.  Neurological: Negative for tremors, syncope, numbness and headaches.  Psychiatric/Behavioral: Negative for confusion and dysphoric mood.     Physical Exam Updated Vital Signs BP (!) 163/82 (BP  Location: Right Arm)   Pulse 82   Temp 99.1 F (37.3 C) (Oral)   Resp 18   Ht 6\' 2"  (1.88 m)   Wt 111.1 kg   SpO2 100%   BMI 31.46 kg/m   Physical Exam  Constitutional: He is oriented to person, place, and time. He appears well-developed and well-nourished.  HENT:  Head: Normocephalic and atraumatic.  Eyes: Pupils are equal, round, and reactive to light. EOM are normal.  Neck: Normal range of motion. Neck supple. No JVD present.  Cardiovascular: Normal rate and regular rhythm. Exam reveals no gallop and no friction rub.  No murmur heard. Pulmonary/Chest: No respiratory distress. He has no wheezes.  Abdominal: He exhibits no distension. There is no rebound and no guarding.  Musculoskeletal: Normal range of motion. He exhibits edema and tenderness.  Right lower leg with edema and erythema.  Starts just above the ankle and goes just below the knee.  No pain to the ankle and the knee.  Neurological: He is alert and oriented to person, place, and time.  Skin: No rash noted. No pallor.  Psychiatric: He has a normal mood and affect. His behavior is normal.  Nursing note and vitals reviewed.    ED Treatments / Results  Labs (all labs ordered are listed, but only abnormal results are displayed) Labs Reviewed - No data to display  EKG None  Radiology No results found.  Procedures Procedures (including critical care time)  Medications Ordered in ED Medications - No data to display   Initial Impression / Assessment and Plan / ED Course  I have reviewed the triage vital signs and the nursing notes.  Pertinent labs & imaging results that were available during my care of the patient were reviewed by me and considered in my medical decision making (see chart for details).     57 yo M with a cc of right lower extremity pain and swelling. He feels that the swelling has improved significantly, though the PCP was concerned because they felt it should have resolved by now, sent here  for DVT study.   The patients results and plan were reviewed and discussed.   Any x-rays performed were independently reviewed by myself.   Differential diagnosis were considered with the presenting HPI.  Medications - No data to display  Vitals:   03/18/18 1405 03/18/18 1407 03/18/18 1534  BP:  (!) 158/98 (!) 163/82  Pulse:  65 82  Resp:  20 18  Temp:  98.5 F (36.9 C) 99.1 F (37.3 C)  TempSrc:  Oral Oral  SpO2:  100% 100%  Weight: 111.1 kg    Height: 6\' 2"  (1.88 m)      Final diagnoses:  Cellulitis of right leg       Final Clinical Impressions(s) / ED Diagnoses  Final diagnoses:  Cellulitis of right leg    ED Discharge Orders    None       Melene Plan, DO 03/20/18 2302

## 2018-03-18 NOTE — ED Triage Notes (Signed)
Right leg pain. His lower leg is red, hot, swollen and painful.

## 2019-12-12 IMAGING — DX DG KNEE COMPLETE 4+V*R*
4 series · 4 of 4 positions shown · non-contrast
Comparison: None.

CLINICAL DATA: Right knee pain and swelling for 2 weeks.

EXAM:
RIGHT KNEE - COMPLETE 4+ VIEW

[knee ap]
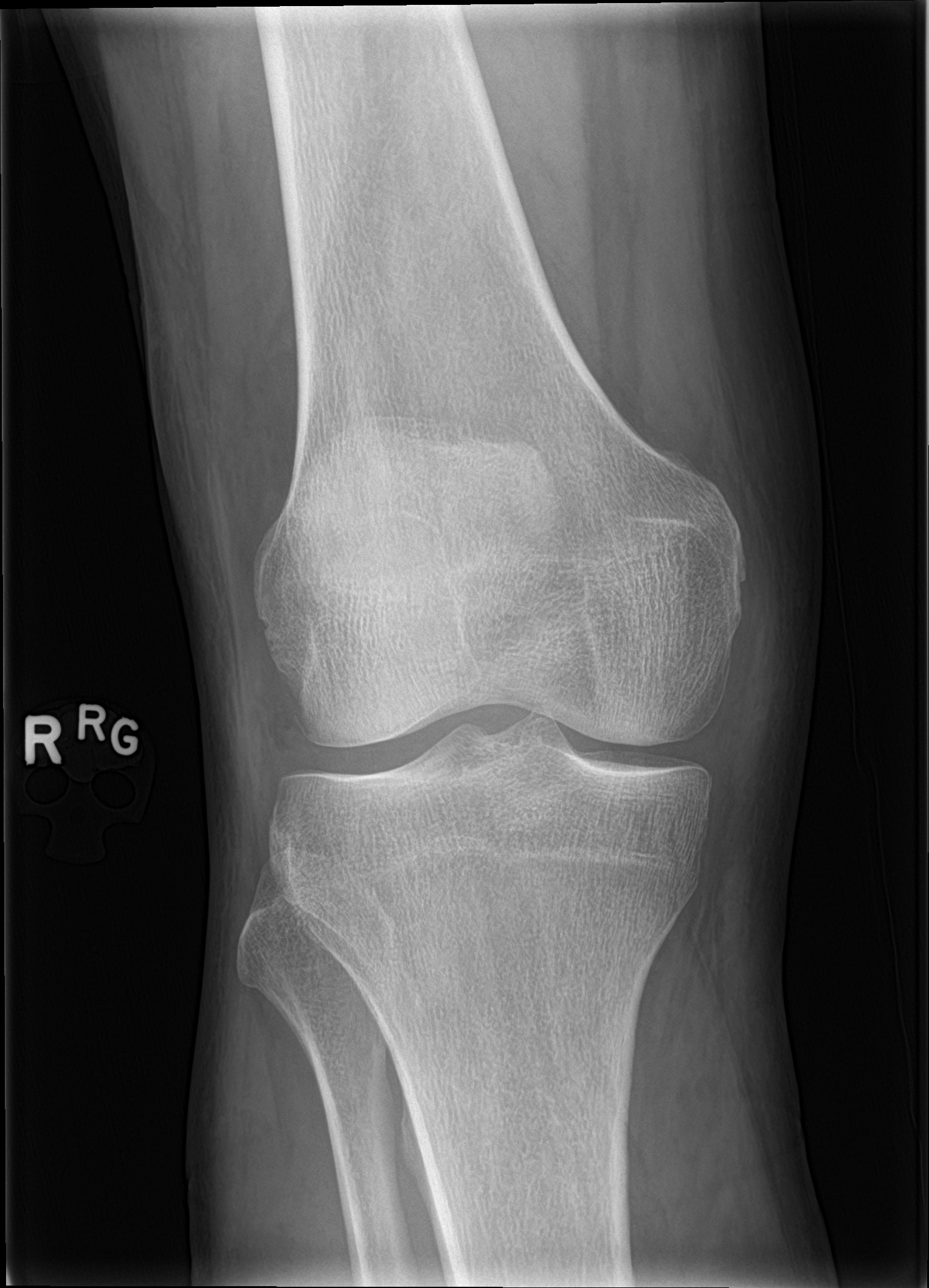

[knee lat]
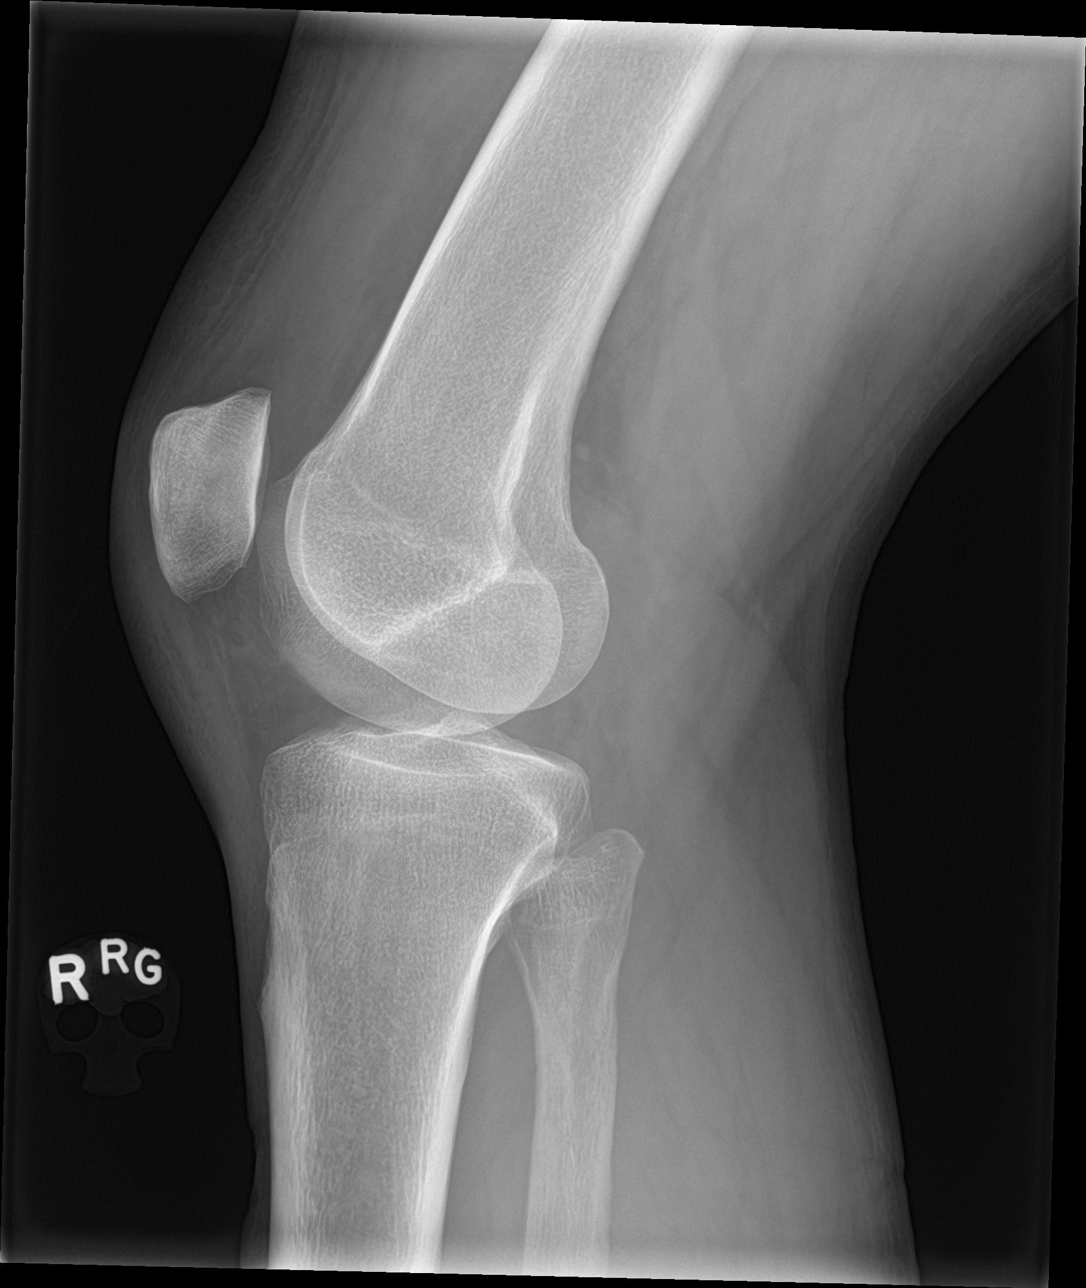

[knee obl (1 of 2)]
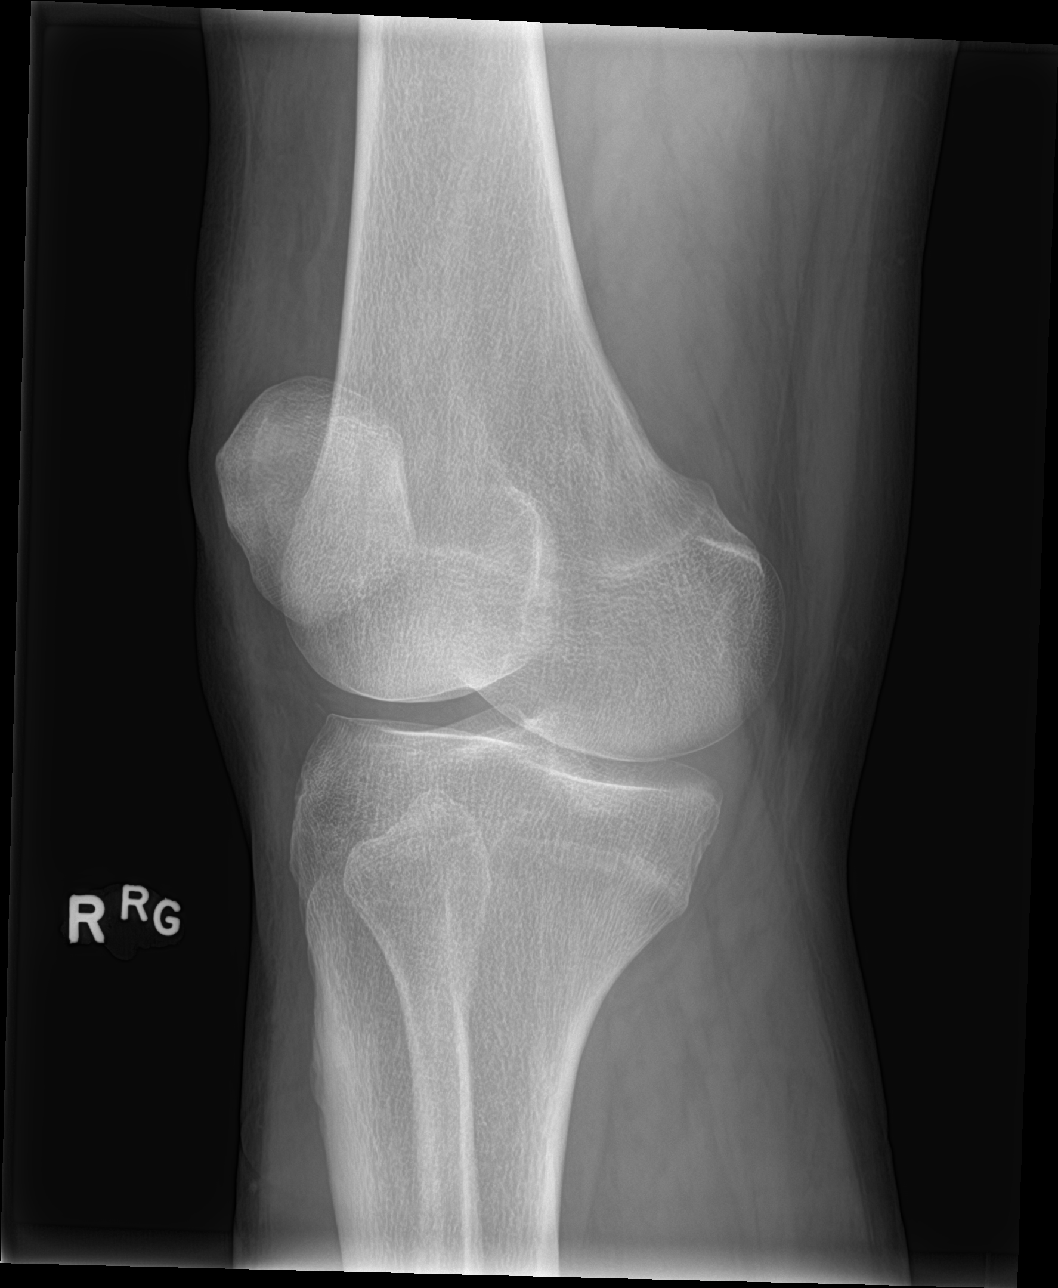

[knee obl (2 of 2)]
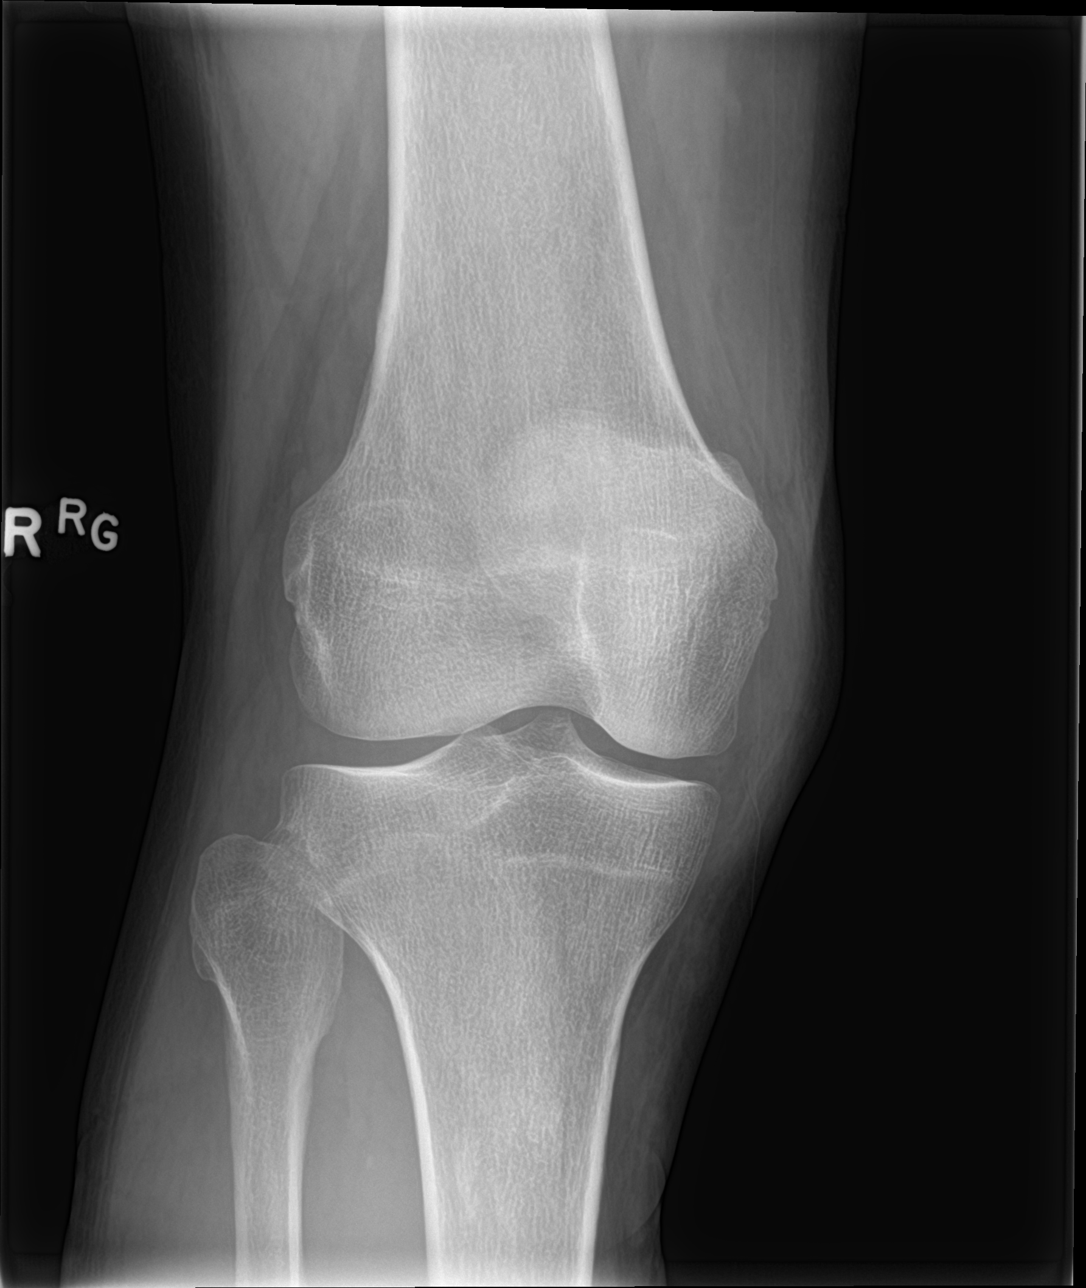

[4 of 4 positions shown; findings below may reference images not displayed]

FINDINGS: There is a moderate suprapatellar joint effusion. None no underlying
fracture or dislocation identified. No significant arthropathy.
IMPRESSION: 1. Suprapatellar joint effusion.

## 2020-08-10 DIAGNOSIS — M5416 Radiculopathy, lumbar region: Secondary | ICD-10-CM | POA: Diagnosis not present

## 2020-08-10 DIAGNOSIS — M5137 Other intervertebral disc degeneration, lumbosacral region: Secondary | ICD-10-CM | POA: Diagnosis not present

## 2020-08-10 DIAGNOSIS — M9903 Segmental and somatic dysfunction of lumbar region: Secondary | ICD-10-CM | POA: Diagnosis not present

## 2020-08-10 DIAGNOSIS — M9904 Segmental and somatic dysfunction of sacral region: Secondary | ICD-10-CM | POA: Diagnosis not present

## 2020-08-15 DIAGNOSIS — M9903 Segmental and somatic dysfunction of lumbar region: Secondary | ICD-10-CM | POA: Diagnosis not present

## 2020-08-15 DIAGNOSIS — M5137 Other intervertebral disc degeneration, lumbosacral region: Secondary | ICD-10-CM | POA: Diagnosis not present

## 2020-08-15 DIAGNOSIS — M9904 Segmental and somatic dysfunction of sacral region: Secondary | ICD-10-CM | POA: Diagnosis not present

## 2020-08-15 DIAGNOSIS — M5416 Radiculopathy, lumbar region: Secondary | ICD-10-CM | POA: Diagnosis not present

## 2020-08-22 DIAGNOSIS — M5416 Radiculopathy, lumbar region: Secondary | ICD-10-CM | POA: Diagnosis not present

## 2020-08-22 DIAGNOSIS — M5137 Other intervertebral disc degeneration, lumbosacral region: Secondary | ICD-10-CM | POA: Diagnosis not present

## 2020-08-22 DIAGNOSIS — M9903 Segmental and somatic dysfunction of lumbar region: Secondary | ICD-10-CM | POA: Diagnosis not present

## 2020-08-22 DIAGNOSIS — M9904 Segmental and somatic dysfunction of sacral region: Secondary | ICD-10-CM | POA: Diagnosis not present

## 2020-08-29 DIAGNOSIS — M9903 Segmental and somatic dysfunction of lumbar region: Secondary | ICD-10-CM | POA: Diagnosis not present

## 2020-08-29 DIAGNOSIS — M9904 Segmental and somatic dysfunction of sacral region: Secondary | ICD-10-CM | POA: Diagnosis not present

## 2020-08-29 DIAGNOSIS — M5137 Other intervertebral disc degeneration, lumbosacral region: Secondary | ICD-10-CM | POA: Diagnosis not present

## 2020-08-29 DIAGNOSIS — M5416 Radiculopathy, lumbar region: Secondary | ICD-10-CM | POA: Diagnosis not present

## 2020-09-05 DIAGNOSIS — M9903 Segmental and somatic dysfunction of lumbar region: Secondary | ICD-10-CM | POA: Diagnosis not present

## 2020-09-05 DIAGNOSIS — M5137 Other intervertebral disc degeneration, lumbosacral region: Secondary | ICD-10-CM | POA: Diagnosis not present

## 2020-09-05 DIAGNOSIS — M5416 Radiculopathy, lumbar region: Secondary | ICD-10-CM | POA: Diagnosis not present

## 2020-09-05 DIAGNOSIS — M9904 Segmental and somatic dysfunction of sacral region: Secondary | ICD-10-CM | POA: Diagnosis not present

## 2021-07-11 DIAGNOSIS — Z13220 Encounter for screening for lipoid disorders: Secondary | ICD-10-CM | POA: Diagnosis not present

## 2021-07-11 DIAGNOSIS — Z0000 Encounter for general adult medical examination without abnormal findings: Secondary | ICD-10-CM | POA: Diagnosis not present

## 2021-07-11 DIAGNOSIS — Z125 Encounter for screening for malignant neoplasm of prostate: Secondary | ICD-10-CM | POA: Diagnosis not present

## 2021-09-27 ENCOUNTER — Encounter (HOSPITAL_BASED_OUTPATIENT_CLINIC_OR_DEPARTMENT_OTHER): Payer: Self-pay | Admitting: Emergency Medicine

## 2021-09-27 ENCOUNTER — Other Ambulatory Visit: Payer: Self-pay

## 2021-09-27 ENCOUNTER — Other Ambulatory Visit (HOSPITAL_BASED_OUTPATIENT_CLINIC_OR_DEPARTMENT_OTHER): Payer: Self-pay

## 2021-09-27 ENCOUNTER — Emergency Department (HOSPITAL_BASED_OUTPATIENT_CLINIC_OR_DEPARTMENT_OTHER)
Admission: EM | Admit: 2021-09-27 | Discharge: 2021-09-27 | Disposition: A | Discharge status: Home / Self Care | Payer: BC Managed Care – PPO | Attending: Emergency Medicine | Admitting: Emergency Medicine

## 2021-09-27 DIAGNOSIS — I4891 Unspecified atrial fibrillation: Secondary | ICD-10-CM | POA: Diagnosis not present

## 2021-09-27 DIAGNOSIS — R002 Palpitations: Secondary | ICD-10-CM | POA: Diagnosis not present

## 2021-09-27 LAB — BASIC METABOLIC PANEL
Anion gap: 4 — ABNORMAL LOW (ref 5–15)
BUN: 25 mg/dL — ABNORMAL HIGH (ref 8–23)
CO2: 26 mmol/L (ref 22–32)
Calcium: 8.8 mg/dL — ABNORMAL LOW (ref 8.9–10.3)
Chloride: 108 mmol/L (ref 98–111)
Creatinine, Ser: 0.89 mg/dL (ref 0.61–1.24)
GFR, Estimated: >60 mL/min (ref >60)
Glucose, Bld: 106 mg/dL — ABNORMAL HIGH (ref 70–99)
Potassium: 3.8 mmol/L (ref 3.5–5.1)
Sodium: 138 mmol/L (ref 135–145)

## 2021-09-27 LAB — CBC WITH DIFFERENTIAL/PLATELET
Abs Immature Granulocytes: 0.03 10*3/uL (ref 0.00–0.07)
Basophils Absolute: 0 10*3/uL (ref 0.0–0.1)
Basophils Relative: 0 %
Eosinophils Absolute: 0 10*3/uL (ref 0.0–0.5)
Eosinophils Relative: 0 %
HCT: 46.5 % (ref 39.0–52.0)
Hemoglobin: 15.6 g/dL (ref 13.0–17.0)
Immature Granulocytes: 0 %
Lymphocytes Relative: 14 %
Lymphs Abs: 1.2 10*3/uL (ref 0.7–4.0)
MCH: 28.8 pg (ref 26.0–34.0)
MCHC: 33.5 g/dL (ref 30.0–36.0)
MCV: 86 fL (ref 80.0–100.0)
Monocytes Absolute: 0.7 10*3/uL (ref 0.1–1.0)
Monocytes Relative: 8 %
Neutro Abs: 7 10*3/uL (ref 1.7–7.7)
Neutrophils Relative %: 78 %
Platelets: 199 10*3/uL (ref 150–400)
RBC: 5.41 MIL/uL (ref 4.22–5.81)
RDW: 13.2 % (ref 11.5–15.5)
WBC: 9.1 10*3/uL (ref 4.0–10.5)
nRBC: 0 % (ref 0.0–0.2)

## 2021-09-27 LAB — TSH: TSH: 1.335 u[IU]/mL (ref 0.350–4.500)

## 2021-09-27 LAB — MAGNESIUM: Magnesium: 2 mg/dL (ref 1.7–2.4)

## 2021-09-27 MED ORDER — SODIUM CHLORIDE 0.9 % IV BOLUS
1000.0000 mL | Freq: Once | INTRAVENOUS | Status: AC
  Administered 2021-09-27: 1000 mL via INTRAVENOUS

## 2021-09-27 MED ORDER — METOPROLOL SUCCINATE ER 25 MG PO TB24
50.0000 mg | ORAL_TABLET | Freq: Once | ORAL | Status: AC
  Administered 2021-09-27: 50 mg via ORAL
  Filled 2021-09-27: qty 2

## 2021-09-27 MED ORDER — METOPROLOL SUCCINATE ER 50 MG PO TB24
50.0000 mg | ORAL_TABLET | Freq: Every day | ORAL | 0 refills | Status: DC
  Filled 2021-09-27: qty 30, 30d supply, fill #0

## 2021-09-27 MED ORDER — DILTIAZEM HCL 25 MG/5ML IV SOLN
15.0000 mg | Freq: Once | INTRAVENOUS | Status: AC
  Administered 2021-09-27: 15 mg via INTRAVENOUS
  Filled 2021-09-27: qty 5

## 2021-09-27 NOTE — ED Notes (Signed)
ED MD has legs raised 90 degrees to convert HR ?

## 2021-09-27 NOTE — ED Notes (Signed)
Brought back to room 4, requested to go to BR before "being hooked up" No distress noted, gait steady  ?

## 2021-09-27 NOTE — ED Triage Notes (Signed)
States while doing his [employer] workout that he felt short winded and pulse rate was fast and irregular.  ?

## 2021-09-27 NOTE — ED Provider Notes (Signed)
?[facility] EMERGENCY DEPARTMENT ?Provider Note ? ? ?CSN: [id number] ?Arrival date & time: 09/27/21  0913 ? ?  ? ?History ? ?Chief Complaint  ?Patient presents with  ? Palpitations  ? ? ?Grant PilgrimFrank Knight is a 61 y.o. male. ? ?The history is provided by the patient.  ?Palpitations ?Palpitations quality:  Fast ?Onset quality:  Sudden ?Duration:  3 hours ?Timing:  Constant ?Progression:  Unchanged ?Chronicity:  New ?Context comment:  Patient developed palpitations after he was doing exercise.  Was running 200 m run and never got his heart rate back to normal. ?Relieved by:  Nothing ?Worsened by:  Nothing ?Associated symptoms: no back pain, no chest pain, no chest pressure, no cough, no diaphoresis, no dizziness, no hemoptysis, no leg pain, no lower extremity edema, no malaise/fatigue, no nausea, no near-syncope, no numbness, no orthopnea, no PND, no shortness of breath, no syncope, no vomiting and no weakness   ?Risk factors: no diabetes mellitus, no heart disease, no hx of atrial fibrillation and no hx of PE   ? ?  ? ?Home Medications ?Prior to Admission medications   ?Medication Sig Start Date End Date Taking? Authorizing Provider  ?metoprolol succinate (TOPROL-XL) 50 MG 24 hr tablet Take 1 tablet (50 mg total) by mouth daily. 09/27/21 10/27/21 Yes Abbegale Stehle, DO  ?ciprofloxacin (CIPRO) 500 MG tablet Take 1 tablet (500 mg total) by mouth every 12 (twelve) hours. 02/16/12   Rancour, Jeannett SeniorStephen, MD  ?CLINDAMYCIN HCL PO Take by mouth.    [provider]  ?diazepam (VALIUM) 5 MG tablet Take 1 tablet (5 mg total) by mouth every 6 (six) hours as needed for anxiety. 02/16/12   Rancour, Jeannett SeniorStephen, MD  ?ibuprofen (ADVIL,MOTRIN) 800 MG tablet Take 1 tablet (800 mg total) by mouth 3 (three) times daily. 02/16/12   Rancour, Jeannett SeniorStephen, MD  ?ondansetron (ZOFRAN) 4 MG tablet Take 1 tablet (4 mg total) by mouth every 6 (six) hours. 02/16/12   Rancour, Jeannett SeniorStephen, MD  ?OVER THE COUNTER MEDICATION Take 2 capsules by mouth 2 (two)  times daily. Medication: JUICE PLUS=2 GARDEN BLEND CAPSULES & 2 ORCHARD CAPSULES    [provider]  ?oxyCODONE-acetaminophen (PERCOCET/ROXICET) 5-325 MG per tablet Take 2 tablets by mouth every 4 (four) hours as needed for pain. 02/16/12   Glynn Octaveancour, Stephen, MD  ?   ? ?Allergies    ?Patient has no known allergies.   ? ?Review of Systems   ?Review of Systems  ?Constitutional:  Negative for diaphoresis and malaise/fatigue.  ?Respiratory:  Negative for cough, hemoptysis and shortness of breath.   ?Cardiovascular:  Positive for palpitations. Negative for chest pain, orthopnea, syncope, PND and near-syncope.  ?Gastrointestinal:  Negative for nausea and vomiting.  ?Musculoskeletal:  Negative for back pain.  ?Neurological:  Negative for dizziness, weakness and numbness.  ? ?Physical Exam ?Updated Vital Signs ?BP 130/86 (BP Location: Right Arm)   Pulse 95   Temp 98.7 ?F (37.1 ?C) (Oral)   Resp 18   Ht 6\' 1"  (1.854 m)   Wt 90.7 kg   SpO2 100%   BMI 26.39 kg/m?  ?Physical Exam ?Vitals and nursing note reviewed.  ?Constitutional:   ?   General: He is not in acute distress. ?   Appearance: He is well-developed. He is not ill-appearing.  ?HENT:  ?   Head: Normocephalic and atraumatic.  ?   Nose: Nose normal.  ?   Mouth/Throat:  ?   Mouth: Mucous membranes are moist.  ?Eyes:  ?   Extraocular Movements:  Extraocular movements intact.  ?   Conjunctiva/sclera: Conjunctivae normal.  ?   Pupils: Pupils are equal, round, and reactive to light.  ?Cardiovascular:  ?   Rate and Rhythm: Regular rhythm. Tachycardia present.  ?   Heart sounds: No murmur heard. ?Pulmonary:  ?   Effort: Pulmonary effort is normal. No respiratory distress.  ?   Breath sounds: Normal breath sounds.  ?Abdominal:  ?   Palpations: Abdomen is soft.  ?   Tenderness: There is no abdominal tenderness.  ?Musculoskeletal:     ?   General: No swelling.  ?   Cervical back: Normal range of motion and neck supple.  ?Skin: ?   General: Skin is warm and dry.  ?    Capillary Refill: Capillary refill takes less than 2 seconds.  ?Neurological:  ?   General: No focal deficit present.  ?   Mental Status: He is alert.  ?Psychiatric:     ?   Mood and Affect: Mood normal.  ? ? ?ED Results / Procedures / Treatments   ?Labs ?(all labs ordered are listed, but only abnormal results are displayed) ?Labs Reviewed  ?BASIC METABOLIC PANEL - Abnormal; Notable for the following components:  ?    Result Value  ? Glucose, Bld 106 (*)   ? BUN 25 (*)   ? Calcium 8.8 (*)   ? Anion gap 4 (*)   ? All other components within normal limits  ?CBC WITH DIFFERENTIAL/PLATELET  ?MAGNESIUM  ?TSH  ? ? ?EKG ?EKG Interpretation ? ?Date/Time:  Wednesday Sep 27 2021 10:04:10 EDT ?Ventricular Rate:  102 ?PR Interval:    ?QRS Duration: 100 ?QT Interval:  356 ?QTC Calculation: 464 ?R Axis:   230 ?Text Interpretation: Atrial fibrillation Inferior infarct, old Confirmed by Lockie Mola, Loletta Harper [phone]) on 09/27/2021 10:14:30 AM ? ?Radiology ?No results found. ? ?Procedures ?Procedures  ? ? ?Medications Ordered in ED ?Medications  ?sodium chloride 0.9 % bolus 1,000 mL (0 mLs Intravenous Stopped 09/27/21 1045)  ?diltiazem (CARDIZEM) injection 15 mg (15 mg Intravenous Given 09/27/21 0958)  ?metoprolol succinate (TOPROL-XL) 24 hr tablet 50 mg (50 mg Oral Given 09/27/21 1028)  ? ? ?ED Course/ Medical Decision Making/ A&P ?  ?                        ?Medical Decision Making ?Amount and/or Complexity of Data Reviewed ?Labs: ordered. ? ?Risk ?Prescription drug management. ? ? ?Grant Knight is here with palpitations.  EKG upon my review and interpretation shows a narrow rhythm tachycardia.  My suspicion is that this is may be SVT or atrial flutter.  Vagal maneuvers were not successful.  Will check CBC, BMP, magnesium, IV fluid bolus.  He was working out about 3 hours ago and after he went for 200 m run heart rate never recovered.  No history of A-fib or other significant medical history.  ? ?With IV fluids heart rate has slowed back to be in  atrial fibrillation with RVR.  We will give a dose IV diltiazem and reevaluate. ? ?Following IV diltiazem, heart rate has been steady now in the 80s to 90s.  CHA2DS2-VASc score is 0. ? ?I talked on the phone with Dr. [place] with cardiology.  Patient did not want to try cardioversion at this time.  Was given a dose of 50 mg of Toprol after discussion with cardiology.  No need to start anticoagulation at this time as [place] Vascor is 0 and patient is not  being cardioverted at this time.  Lab work per my review and interpretation is unremarkable.  No significant electrolyte abnormalities or kidney injury.  Tolerating medications well.  Vital signs are stable.  He still remains in atrial fibrillation but in the 80s.  He understands return if symptoms worsen.  We will follow-up in atrial fibrillation clinic.  Discharged in good condition. ? ?This chart was dictated using voice recognition software.  Despite best efforts to proofread,  errors can occur which can change the documentation meaning.  ? ? ? ?   ?   ? ? ? ? ? ? ?Final Clinical Impression(s) / ED Diagnoses ?Final diagnoses:  ?Atrial fibrillation with RVR (HCC)  ? ? ?Rx / DC Orders ?ED Discharge Orders   ? ?      Ordered  ?  Amb referral to AFIB Clinic       ? 09/27/21 0954  ?  metoprolol succinate (TOPROL-XL) 50 MG 24 hr tablet  Daily       ? 09/27/21 1140  ? ?  ?  ? ?  ? ? ?  ?Virgina Norfolk, DO ?09/27/21 1141 ? ?

## 2021-09-27 NOTE — Discharge Instructions (Addendum)
Take metoprolol as prescribed.  Take your next dose tomorrow.  Please return if symptoms worsen as discussed.  Follow-up with cardiology team.  They should call you with an appointment but I have also provided you with information to follow-up with them as well and confirm that appointment. ?

## 2021-09-27 NOTE — ED Notes (Signed)
ED MD at bedside, blowing into syringe to covert SVT ?

## 2021-09-27 NOTE — ED Notes (Signed)
ED Provider at bedside. 

## 2021-09-28 ENCOUNTER — Encounter (HOSPITAL_COMMUNITY): Payer: Self-pay | Admitting: Nurse Practitioner

## 2021-09-28 ENCOUNTER — Ambulatory Visit (HOSPITAL_COMMUNITY)
Admission: RE | Admit: 2021-09-28 | Discharge: 2021-09-28 | Disposition: A | Discharge status: Home / Self Care | Payer: BC Managed Care – PPO | Source: Ambulatory Visit | Attending: Nurse Practitioner | Admitting: Nurse Practitioner

## 2021-09-28 VITALS — BP 156/76 | HR 40 | Ht 73.0 in | Wt 216.2 lb

## 2021-09-28 DIAGNOSIS — I4891 Unspecified atrial fibrillation: Secondary | ICD-10-CM | POA: Insufficient documentation

## 2021-09-28 DIAGNOSIS — R001 Bradycardia, unspecified: Secondary | ICD-10-CM | POA: Diagnosis not present

## 2021-09-28 MED ORDER — METOPROLOL SUCCINATE ER 50 MG PO TB24: ORAL_TABLET | ORAL | 0 refills | Status: DC

## 2021-09-28 NOTE — Progress Notes (Signed)
Primary Care Physician: [name] @ [place] Referring Physician: [facility] f/u   Grant Knight is a 61 y.o. male without a significant PM that presented yesterday to the ED after developing elevated HR after exercise. His HR was slowed but he was d/c in afib as he did not  want to try cardioversion.   He does not smoke, stopped  alcohol 3 years ago, witnessed snoring and apnea per wife. He exercises on a regular basis. No tobacco, mod caffeine use  which he has reduced.   He has now converted to marked  sinus brady at 40 bpm. He feels tired.   Today, he denies symptoms of palpitations, chest pain, shortness of breath, orthopnea, PND, lower extremity edema, dizziness, presyncope, syncope, or neurologic sequela. The patient is tolerating medications without difficulties and is otherwise without complaint today.   No past medical history on file. Past Surgical History:  Procedure Laterality Date   SHOULDER SURGERY      Current Outpatient Medications  Medication Sig Dispense Refill   metoprolol succinate (TOPROL-XL) 50 MG 24 hr tablet Take 1 tablet (50 mg total) by mouth daily. 30 tablet 0   Multiple Vitamin (MULTIVITAMIN) tablet Take 1 tablet by mouth daily. [place]-     Omega-3 Fatty Acids (OMEGA-3 FISH OIL PO) Take 1 capsule by mouth every morning.     No current facility-administered medications for this encounter.    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, is married and has son and daughter, no drugs or smoking, drinks socially 3-4 times per week   Social Determinants of Health   Financial Resource Strain: Not on file  Food Insecurity: Not on file  Transportation Needs: Not on file  Physical Activity: Not on file  Stress: Not on file  Social Connections: Not on file  Intimate Partner Violence: Not on file    Family History  Problem Relation Age of Onset   Lung cancer Mother        deceased   Diabetes Mother    Diabetes Sister        deceased   Breast cancer Sister     ROS- All systems are reviewed and negative except as per the HPI above  Physical Exam: Vitals:   09/28/21 1543  Weight: 98.1 kg  Height: 6\' 1"  (1.854 m)   Wt Readings from Last 3 Encounters:  09/28/21 98.1 kg  09/27/21 90.7 kg  03/18/18 111.1 kg    Labs: Lab Results  Component Value Date   NA 138 09/27/2021   K 3.8 09/27/2021   CL 108 09/27/2021   CO2 26 09/27/2021   GLUCOSE 106 (H) 09/27/2021   BUN 25 (H) 09/27/2021   CREATININE 0.89 09/27/2021   CALCIUM 8.8 (L) 09/27/2021   MG 2.0 09/27/2021   No results found for: INR No results found for:  CHOL, HDL, LDLCALC, TRIG   GEN- The patient is well appearing, alert and oriented x 3 today.   Head- normocephalic, atraumatic Eyes-  Sclera clear, conjunctiva pink Ears- hearing intact Oropharynx- clear Neck- supple, no JVP Lymph- no cervical lymphadenopathy Lungs- Clear to ausculation bilaterally, normal work of breathing Heart- Regular rate and rhythm, no murmurs, rubs or gallops, PMI not laterally displaced GI- soft, NT, ND, + BS Extremities- no clubbing, cyanosis, or edema MS- no significant deformity or atrophy Skin- no rash or lesion Psych- euthymic mood, full affect Neuro- strength and sensation are intact  EKG-Vent. rate 128 BPM ED EKG PR interval * ms QRS duration 91 ms QT/QTcB 334/480 ms P-R-T axes * 218 27 Atrial fibrillation Markedly posterior QRS axis Minimal ST  depression, inferior leads Borderline prolonged QT interval Confirmed by Curatolo  EKG TODAY-  PR interval 160 ms QRS duration 98 ms QT/QTcB 480/391 ms P-R-T axes 70 59 56 Marked sinus bradycardia Possible Inferior infarct , age undetermined Cannot rule out Anterior infarct , age undetermined Abnormal ECG When compared with ECG of 27-Sep-2021 10:04, PREVIOUS ECG IS PRESENT  Assessment and Plan:  1. New onset Afib General education re afib Triggers discussed  He has bradycardia at 40 bpm  on 50 mg Toprol daily Will ask him to take 25 mg tomorrow am and if continues in SR then stop He can keep on hand  and use this as needed for afib episodes  Echo ordered Sleep study ordered for witnessed  snoring/apnea   2. CHA2DS2VASc  of 0 No anticoagulation needed at this time    I will call results of echo with plans for f/u at that time   Butch Penny C. Judiann Celia, [facility] [address] [place], [place] [address] [phone]

## 2021-09-28 NOTE — Patient Instructions (Signed)
Metoprolol -- take a 1/2 tablet tomorrow and then stop as of Saturday, Going forward use only as needed 1/2 tablet daily for afib with HR over 100

## 2021-10-03 ENCOUNTER — Telehealth: Payer: Self-pay | Admitting: *Deleted

## 2021-10-03 NOTE — Telephone Encounter (Signed)
Prior Authorization for SPLIT NIGHT sent to [facility] via web portal.  Approval Valid Through: 10/03/2021 - 12/01/2021

## 2021-10-12 ENCOUNTER — Ambulatory Visit (HOSPITAL_COMMUNITY)
Admission: RE | Admit: 2021-10-12 | Discharge: 2021-10-12 | Disposition: A | Discharge status: Home / Self Care | Payer: BC Managed Care – PPO | Source: Ambulatory Visit | Attending: Nurse Practitioner | Admitting: Nurse Practitioner

## 2021-10-12 DIAGNOSIS — I4891 Unspecified atrial fibrillation: Secondary | ICD-10-CM | POA: Diagnosis not present

## 2021-10-12 DIAGNOSIS — I517 Cardiomegaly: Secondary | ICD-10-CM | POA: Diagnosis not present

## 2021-10-12 LAB — ECHOCARDIOGRAM COMPLETE
Area-P 1/2: 3.08 cm2
Calc EF: 63.5 %
S' Lateral: 2.7 cm
Single Plane A2C EF: 58.5 %
Single Plane A4C EF: 70.5 %

## 2021-10-27 ENCOUNTER — Other Ambulatory Visit (HOSPITAL_COMMUNITY): Payer: Self-pay | Admitting: *Deleted

## 2021-10-27 DIAGNOSIS — I480 Paroxysmal atrial fibrillation: Secondary | ICD-10-CM

## 2021-11-23 ENCOUNTER — Ambulatory Visit (HOSPITAL_BASED_OUTPATIENT_CLINIC_OR_DEPARTMENT_OTHER): Payer: BC Managed Care – PPO | Attending: Cardiology | Admitting: Cardiology

## 2021-11-23 DIAGNOSIS — R0683 Snoring: Secondary | ICD-10-CM | POA: Insufficient documentation

## 2021-11-23 DIAGNOSIS — I4891 Unspecified atrial fibrillation: Secondary | ICD-10-CM | POA: Diagnosis not present

## 2021-11-24 NOTE — Procedures (Signed)
   Patient Name: [name], Cheek Date:11/23/2021 Gender: Male D.O.B: 1960/12/09 Age (years): 61 Referring Provider: Newman Nip Height (inches): 71 Interpreting Physician: Armanda Magic MD, ABSM Weight (lbs): 205 RPSGT: Cherylann Parr BMI: 29 MRN: [id number] Neck Size: 16.50  CLINICAL INFORMATION Sleep Study Type: NPSG  Indication for sleep study: Snoring, Witnesses Apnea / Gasping During Sleep  Epworth Sleepiness Score: 4  SLEEP STUDY TECHNIQUE As per the AASM Manual for the Scoring of Sleep and Associated Events v2.3 (April 2016) with a hypopnea requiring 4% desaturations.  The channels recorded and monitored were frontal, central and occipital EEG, electrooculogram (EOG), submentalis EMG (chin), nasal and oral airflow, thoracic and abdominal wall motion, anterior tibialis EMG, snore microphone, electrocardiogram, and pulse oximetry.  MEDICATIONS Medications self-administered by patient taken the night of the study : N/A  SLEEP ARCHITECTURE The study was initiated at 10:34:35 PM and ended at 4:53:20 AM.  Sleep onset time was 14.6 minutes and the sleep efficiency was 79.8%. The total sleep time was 302.1 minutes.  Stage REM latency was 68.0 minutes.  The patient spent 6.6% of the night in stage N1 sleep, 71.2% in stage N2 sleep, 0.0% in stage N3 and 22.2% in REM.  Alpha intrusion was absent.  Supine sleep was 8.11%.  RESPIRATORY PARAMETERS The overall apnea/hypopnea index (AHI) was 0.6 per hour. There were 1 total apneas, including 0 obstructive, 1 central and 0 mixed apneas. There were 2 hypopneas and 0 RERAs.  The AHI during Stage REM sleep was 0.9 per hour.  AHI while supine was 4.9 per hour.  The mean oxygen saturation was 96.4%. The minimum SpO2 during sleep was 93.0%.  moderate snoring was noted during this study.  CARDIAC DATA The 2 lead EKG demonstrated sinus rhythm. The mean heart rate was 47.5 beats per minute. Other EKG findings include:  None.  LEG MOVEMENT DATA The total PLMS were 0 with a resulting PLMS index of 0.0. Associated arousal with leg movement index was 0.0 .  IMPRESSIONS - No significant obstructive sleep apnea occurred during this study (AHI = 0.6/h). - No significant central sleep apnea occurred during this study (CAI = 0.2/h). - The patient had minimal or no oxygen desaturation during the study (Min O2 = 93.0%) - The patient snored with moderate snoring volume. - Clinically significant periodic limb movements did not occur during sleep. No significant associated arousals.  DIAGNOSIS - Normal Study  RECOMMENDATIONS - Avoid alcohol, sedatives and other CNS depressants that may worsen sleep apnea and disrupt normal sleep architecture. - Sleep hygiene should be reviewed to assess factors that may improve sleep quality. - Weight management and regular exercise should be initiated or continued if appropriate.  [Electronically signed] 11/24/2021 12:19 PM  Armanda Magic MD, ABSM Diplomate, American Board of Sleep Medicine

## 2021-12-12 ENCOUNTER — Telehealth: Payer: Self-pay | Admitting: *Deleted

## 2021-12-12 NOTE — Telephone Encounter (Signed)
-----   Message from Gaynelle Cage, CMA sent at 12/12/2021  8:55 AM EDT -----  ----- Message ----- From: Quintella Reichert, MD Sent: 11/24/2021  12:20 PM EDT To: Cv Div Sleep Studies  Please let patient know that sleep study showed no significant sleep apnea.

## 2021-12-12 NOTE — Telephone Encounter (Signed)
The patient has been notified of the result. Left detailed message on voicemail and informed patient to call back .Latrelle Dodrill, CMA 12/12/2021 6:07 PM

## 2021-12-21 DIAGNOSIS — H66001 Acute suppurative otitis media without spontaneous rupture of ear drum, right ear: Secondary | ICD-10-CM | POA: Diagnosis not present

## 2021-12-21 DIAGNOSIS — R030 Elevated blood-pressure reading, without diagnosis of hypertension: Secondary | ICD-10-CM | POA: Diagnosis not present

## 2021-12-21 DIAGNOSIS — R0981 Nasal congestion: Secondary | ICD-10-CM | POA: Diagnosis not present

## 2021-12-21 DIAGNOSIS — H938X3 Other specified disorders of ear, bilateral: Secondary | ICD-10-CM | POA: Diagnosis not present

## 2022-01-04 ENCOUNTER — Ambulatory Visit (HOSPITAL_COMMUNITY)
Admission: RE | Admit: 2022-01-04 | Discharge: 2022-01-04 | Disposition: A | Discharge status: Home / Self Care | Payer: BC Managed Care – PPO | Source: Ambulatory Visit | Attending: Nurse Practitioner | Admitting: Nurse Practitioner

## 2022-01-04 ENCOUNTER — Encounter (HOSPITAL_COMMUNITY): Payer: Self-pay | Admitting: Nurse Practitioner

## 2022-01-04 VITALS — BP 180/76 | HR 48 | Ht 73.0 in | Wt 213.0 lb

## 2022-01-04 DIAGNOSIS — I4891 Unspecified atrial fibrillation: Secondary | ICD-10-CM | POA: Diagnosis not present

## 2022-01-04 DIAGNOSIS — R001 Bradycardia, unspecified: Secondary | ICD-10-CM | POA: Diagnosis not present

## 2022-01-04 DIAGNOSIS — R0683 Snoring: Secondary | ICD-10-CM | POA: Diagnosis not present

## 2022-01-04 DIAGNOSIS — H65193 Other acute nonsuppurative otitis media, bilateral: Secondary | ICD-10-CM | POA: Diagnosis not present

## 2022-01-04 DIAGNOSIS — B49 Unspecified mycosis: Secondary | ICD-10-CM | POA: Diagnosis not present

## 2022-01-04 DIAGNOSIS — Z79899 Other long term (current) drug therapy: Secondary | ICD-10-CM | POA: Insufficient documentation

## 2022-01-04 DIAGNOSIS — J3089 Other allergic rhinitis: Secondary | ICD-10-CM | POA: Diagnosis not present

## 2022-01-04 MED ORDER — DILTIAZEM HCL 30 MG PO TABS: ORAL_TABLET | ORAL | 1 refills | Status: DC

## 2022-01-04 NOTE — Progress Notes (Signed)
Primary Care Physician: [facility] [place] Referring Physician: [facility] f/u   Grant Knight is a 61 y.o. male without a significant PM that presented yesterday to the ED after developing elevated HR after exercise. His HR was slowed but he was d/c in afib as he did not  want to try cardioversion.   He does not smoke, stopped  alcohol 3 years ago, witnessed snoring and apnea per wife. He exercises on a regular basis. No tobacco, mod caffeine use  which he has reduced.   He has now converted to marked  sinus brady at 40 bpm. He feels tired.   F/u in afib clinic 01/04/22. He is here as he had an episode of afib last week after pushing it pretty hard in the gym. He called the office and was told to take an extra 1/2 tab of metoprolol and did return to SR in around 2 hours. No further issues since then.  Today, he denies symptoms of palpitations, chest pain, shortness of breath, orthopnea, PND, lower extremity edema, dizziness, presyncope, syncope, or neurologic sequela. The patient is tolerating medications without difficulties and is otherwise without complaint today.   No past medical history on file. Past Surgical History:  Procedure Laterality Date   SHOULDER SURGERY      Current Outpatient Medications  Medication Sig Dispense Refill   diltiazem (CARDIZEM) 30 MG tablet Take 1 tablet every 4 hours AS NEEDED for heart rate >100 as long as blood pressure >100. 30 tablet 1   Omega-3 Fatty Acids (OMEGA-3 FISH OIL PO) Take 1 capsule by mouth every morning.     metoprolol succinate (TOPROL-XL) 50 MG 24 hr tablet Take 1/2 tablet by mouth daily as needed for breakthrough afib (Patient not taking: Reported on 01/04/2022) 30 tablet 0   No current facility-administered medications for this encounter.    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, is married and has son and daughter, no drugs or smoking, drinks socially 3-4 times per week   Social Determinants of Health   Financial Resource Strain: Not on file  Food Insecurity: Not on file  Transportation Needs: Not on file  Physical Activity: Not on file  Stress: Not on file  Social Connections: Not on file  Intimate Partner Violence: Not on file    Family History  Problem Relation Age of Onset   Lung cancer Mother        deceased   Diabetes Mother    Diabetes Sister        deceased   Breast cancer Sister     ROS- All systems are reviewed and negative except as per the HPI above  Physical Exam: Vitals:   01/04/22 1000  BP: (!) 180/76  Pulse: (!) 48  Weight: 96.6 kg  Height: 6\' 1"  (1.854 m)   Wt Readings from Last 3  Encounters:  01/04/22 96.6 kg  11/23/21 93 kg  09/28/21 98.1 kg    Labs: Lab Results  Component Value Date   NA 138 09/27/2021   K 3.8 09/27/2021   CL 108 09/27/2021   CO2 26 09/27/2021   GLUCOSE 106 (H) 09/27/2021   BUN 25 (H) 09/27/2021   CREATININE 0.89 09/27/2021   CALCIUM 8.8 (L) 09/27/2021   MG 2.0 09/27/2021   No results found for: "INR" No results found for: "CHOL", "HDL", "LDLCALC", "TRIG"   GEN- The patient is well appearing, alert and oriented x 3 today.   Head- normocephalic, atraumatic Eyes-  Sclera clear, conjunctiva pink Ears- hearing intact Oropharynx- clear Neck- supple, no JVP Lymph- no cervical lymphadenopathy Lungs- Clear to ausculation bilaterally, normal work of breathing Heart- Regular rate and rhythm, no murmurs, rubs or gallops,  PMI not laterally displaced GI- soft, NT, ND, + BS Extremities- no clubbing, cyanosis, or edema MS- no significant deformity or atrophy Skin- no rash or lesion Psych- euthymic mood, full affect Neuro- strength and sensation are intact  EKG-Vent. rate 128 BPM ED EKG PR interval * ms QRS duration 91 ms QT/QTcB 334/480 ms P-R-T axes * 218 27 Atrial fibrillation Markedly posterior QRS axis Minimal ST depression, inferior leads Borderline prolonged QT interval Confirmed by Curatolo  EKG TODAY-  Vent. rate 48 BPM PR interval 134 ms QRS duration 102 ms QT/QTcB 446/398 ms P-R-T axes 77 85 52 Sinus bradycardia Inferior infarct , age undetermined Possible Anterior infarct , age undetermined Abnormal ECG When compared with ECG of 28-Sep-2021 16:04, PREVIOUS ECG IS PRESENT   Assessment and Plan:  1. New onset Afib Ssen in ER  Self conversion to S brady after being d/c in afib with start of BB He had bradycardia at 40 bpm  on 50 mg Toprol daily, stopped daily BB Had onset of afib last week  with none x 3 months while exercising He did convert with 1/2 tab metoprolol succinate after 2 hours  He does have a HR in the upper 40's at baseline so this complicates  use of daily meds for rate/rhythm control  He is active doing cross fit and would like to feel his can push himself without fear of arhythmia  I will rx 30 mg Cardizem to use as needed for afib with shorter duration of drug than metoprolol  and refer to EP to  discuss possible ablation    Sleep study ordered for witnessed  snoring/apnea and did not show any significant sleep apnea   2. CHA2DS2VASc  of 0 No anticoagulation needed at this time     [name]. [facility] [address] [place], [place] [address] [phone]

## 2022-01-05 NOTE — Telephone Encounter (Signed)
Return call:  Pt is agreeable to normal results.

## 2022-01-12 DIAGNOSIS — L500 Allergic urticaria: Secondary | ICD-10-CM | POA: Diagnosis not present

## 2022-01-23 ENCOUNTER — Encounter: Payer: Self-pay | Admitting: Nurse Practitioner

## 2022-02-04 NOTE — Progress Notes (Deleted)
Cardiology Office Note:    Date:  02/04/2022   ID:  Grant Knight, DOB Feb 13, 1961, MRN [id number]  PCP:  [facility] Providers Cardiologist:  None {  Referring MD: [facility]*    History of Present Illness:    Grant Knight is a 61 y.o. male with a hx of newly diagnosed paroxymal Afib who was referred by [facility] for further evaluation of paroxysmal Afib.  Was seen in the ER 01/03/22 for developing significant tachycardia after exercise. He was given dilt 15mg  IV and metop 50mg  PO with improvement of rates. He declined DCCV at that time. He was discharged home on metop 50mg  XL daily with follow-up in Afib clinic. Was not started on [facility] due to CHADs-vasc 0.   Was last seen by on 01/04/22. He had converted to NSR with HR 40s with associated fatigue. He was changed to dilt 30mg  prn for breakthrough episodes as resting HR were in the 40s at baseline.   Today, ***  Past Medical History:  Diagnosis Date   Atrial fibrillation (HCC)    Cellulitis of right leg    Effusion of right knee    Lumbar back pain     Past Surgical History:  Procedure Laterality Date   SHOULDER SURGERY      Current Medications: No outpatient medications have been marked as taking for the 02/07/22 encounter (Appointment) with Rudi Coco, MD.     Allergies:   Patient has no known allergies.   Social History   Socioeconomic History   Marital status: Married    Spouse name: Not on file   Number of children: Not on file   Years of education: Not on file   Highest education level: Not on file  Occupational History   Not on file  Tobacco Use   Smoking status: Never   Smokeless tobacco: Never  Substance and Sexual Activity   Alcohol use: Not Currently   Drug use: No   Sexual activity: Not on file  Other Topics Concern   Not on file  Social History Narrative   Lives at home and is a 01/06/22, is  married and has son and daughter, no drugs or smoking, drinks socially 3-4 times per week   Social Determinants of Health   Financial Resource Strain: Not on file  Food Insecurity: Not on file  Transportation Needs: Not on file  Physical Activity: Not on file  Stress: Not on file  Social Connections: Not on file     Family History: The patient's ***family history includes Breast cancer in his sister; Diabetes in his mother and sister; Lung cancer in his mother.  ROS:   Please see the history of present illness.    *** All other systems reviewed and are negative.  EKGs/Labs/Other Studies Reviewed:    The following studies were reviewed today: TTE 11-07-2021: IMPRESSIONS     1. Left ventricular ejection fraction, by estimation, is 60 to 65%. The  left ventricle has normal function. The left ventricle has no regional  wall motion abnormalities. There is mild left ventricular hypertrophy.  Left ventricular diastolic parameters  were normal.   2. Right ventricular systolic function is normal. The right ventricular  size is mildly enlarged. There is normal pulmonary artery systolic  pressure.   3. Left atrial size was mild to moderately dilated.   4. Right atrial size was mildly dilated.   5. The  mitral valve is normal in structure. No evidence of mitral valve  regurgitation.   6. The aortic valve is normal in structure. Aortic valve regurgitation is  not visualized.   7. Aortic no significant ascending aortic aneurysm.   8. The inferior vena cava is dilated in size with >50% respiratory  variability, suggesting right atrial pressure of 8 mmHg.   Comparison(s): No prior Echocardiogram.   EKG:  EKG is *** ordered today.  The ekg ordered today demonstrates ***  Recent Labs: 09/27/2021: BUN 25; Creatinine, Ser 0.89; Hemoglobin 15.6; Magnesium 2.0; Platelets 199; Potassium 3.8; Sodium 138; TSH 1.335  Recent Lipid Panel No results found for: "CHOL", "TRIG", "HDL", "CHOLHDL",  "VLDL", "LDLCALC", "LDLDIRECT"   Risk Assessment/Calculations:   {Does this patient have ATRIAL FIBRILLATION?[phone]}  No BP recorded.  {Refresh Note OR Click here to enter BP  :1}***         Physical Exam:    VS:  There were no vitals taken for this visit.    Wt Readings from Last 3 Encounters:  01/04/22 213 lb (96.6 kg)  11/23/21 205 lb (93 kg)  09/28/21 216 lb 3.2 oz (98.1 kg)     GEN: *** Well nourished, well developed in no acute distress HEENT: Normal NECK: No JVD; No carotid bruits LYMPHATICS: No lymphadenopathy CARDIAC: ***RRR, no murmurs, rubs, gallops RESPIRATORY:  Clear to auscultation without rales, wheezing or rhonchi  ABDOMEN: Soft, non-tender, non-distended MUSCULOSKELETAL:  No edema; No deformity  SKIN: Warm and dry NEUROLOGIC:  Alert and oriented x 3 PSYCHIATRIC:  Normal affect   ASSESSMENT:    No diagnosis found. PLAN:    In order of problems listed above:  #Paroxysmal Afib: CHADs-vasc 0. TTE with LVEF 60-65%, mild LVH, nromal RV, mild-to-moderate LAE, mild RAE, no significant valve disease. Having intermittent episodes but remains in NSR today. Did not tolerate scheduled BB due to low resting HR in the 40s. Has been placed on dilt 30mg  prn as needed for breakthrough episodes. Was referred to EP as well for consideration for ablation. -Referred to EP for consideration of ablation -Continue dilt 30mg  prn for breakthrough episodes -Did not tolerate scheduled metop due to low resting HR -Sleep study without significant OSA      {Are you ordering a CV Procedure (e.g. stress test, cath, DCCV, TEE, etc)?   Press F2        :[phone]}    Medication Adjustments/Labs and Tests Ordered: Current medicines are reviewed at length with the patient today.  Concerns regarding medicines are outlined above.  No orders of the defined types were placed in this encounter.  No orders of the defined types were placed in this encounter.   There are no Patient  Instructions on file for this visit.   Signed, Freada Bergeron, MD  02/04/2022 1:42 PM    [place]

## 2022-02-07 ENCOUNTER — Ambulatory Visit: Payer: BC Managed Care – PPO | Admitting: Cardiology

## 2022-02-07 NOTE — Progress Notes (Unsigned)
Cardiology Office Note:    Date:  02/08/2022   ID:  Grant Knight, DOB February 18, 1961, MRN [id number]  PCP:  [facility] Prov[facility] Cardiologist:  Lenna Sciara, MD Referring MD: [facility]   Chief Complaint/Reason for Referral: Paroxysmal atrial fibrillation  ASSESSMENT:    1. Paroxysmal atrial fibrillation (HCC)     PLAN:    In order of problems listed above: 1.  Paroxysmal atrial fibrillation: Continue current as needed diltiazem; CV 2 score is 0 so no anticoagulation at this time.  The patient's symptoms are relatively infrequent.  Discussed with the patient and we will hold off on EP referral until he is medically refractory or symptom burden becomes intolerable.  Follow-up in 6 months or earlier if needed.          Dispo:  Return in about 6 months (around 08/09/2022).      Medication Adjustments/Labs and Tests Ordered: Current medicines are reviewed at length with the patient today.  Concerns regarding medicines are outlined above.  The following changes have been made:  no change   Labs/tests ordered: No orders of the defined types were placed in this encounter.   Medication Changes: No orders of the defined types were placed in this encounter.    Current medicines are reviewed at length with the patient today.  The patient does not have concerns regarding medicines.   History of Present Illness:    FOCUSED PROBLEM LIST:   1.  Paroxysmal atrial fibrillation with CV 2 score of 0  The patient is a 61 y.o. male with the indicated medical history here for recommendations regarding paroxysmal atrial fibrillation.  It seems that he was seen in the atrial fibrillation clinic last month.  He was noted to have symptomatic atrial fibrillation with conversion to sinus rhythm after an extra dose of metoprolol.  At that visit he was prescribed Cardizem.    The patient tells me he has developed atrial fibrillation  relatively infrequently.  It seems to respond to as needed Cardizem.  He denies any presyncope, syncope, chest pain, peripheral edema, paroxysmal nocturnal dyspnea.  He is quite active and participates in CrossFit on a regular basis.  He has required no emergency room visits or hospitalizations.         Current Medications: Current Meds  Medication Sig   diltiazem (CARDIZEM) 30 MG tablet Take 1 tablet every 4 hours AS NEEDED for heart rate >100 as long as blood pressure >100.   Omega-3 Fatty Acids (OMEGA-3 FISH OIL PO) Take 1 capsule by mouth every morning.     Allergies:    Patient has no known allergies.   Social History:   Social History   Tobacco Use   Smoking status: Never   Smokeless tobacco: Never  Substance Use Topics   Alcohol use: Not Currently   Drug use: No     Family Hx: Family History  Problem Relation Age of Onset   Lung cancer Mother        deceased   Diabetes Mother    Diabetes Sister        deceased   Breast cancer Sister      Review of Systems:   Please see the history of present illness.    All other systems reviewed and are negative.     EKGs/Labs/Other Test Reviewed:    EKG:  EKG performed August 2023 that I personally reviewed demonstrates sinus bradycardia with inferior infarction pattern.  Prior CV studies:  TTE 2023  1. Left ventricular ejection fraction, by estimation, is 60 to 65%. The  left ventricle has normal function. The left ventricle has no regional  wall motion abnormalities. There is mild left ventricular hypertrophy.  Left ventricular diastolic parameters  were normal.   2. Right ventricular systolic function is normal. The right ventricular  size is mildly enlarged. There is normal pulmonary artery systolic  pressure.   3. Left atrial size was mild to moderately dilated.   4. Right atrial size was mildly dilated.   5. The mitral valve is normal in structure. No evidence of mitral valve  regurgitation.   6. The  aortic valve is normal in structure. Aortic valve regurgitation is  not visualized.   7. Aortic no significant ascending aortic aneurysm.   8. The inferior vena cava is dilated in size with >50% respiratory  variability, suggesting right atrial pressure of 8 mmHg.   Other studies Reviewed: Review of the additional studies/records demonstrates: CT abdomen pelvis 2013 without aortic atherosclerosis  Recent Labs: 09/27/2021: BUN 25; Creatinine, Ser 0.89; Hemoglobin 15.6; Magnesium 2.0; Platelets 199; Potassium 3.8; Sodium 138; TSH 1.335   Recent Lipid Panel No results found for: "CHOL", "TRIG", "HDL", "LDLCALC", "LDLDIRECT"  Risk Assessment/Calculations:     CHA2DS2-VASc Score = 0   This indicates a 0.2% annual risk of stroke. The patient's score is based upon: CHF History: 0 HTN History: 0 Diabetes History: 0 Stroke History: 0 Vascular Disease History: 0 Age Score: 0 Gender Score: 0               Physical Exam:    VS:  BP 132/70   Pulse (!) 51   Ht 6\' 1"  (1.854 m)   Wt 215 lb 9.6 oz (97.8 kg)   SpO2 98%   BMI 28.44 kg/m    Wt Readings from Last 3 Encounters:  02/08/22 215 lb 9.6 oz (97.8 kg)  01/04/22 213 lb (96.6 kg)  11/23/21 205 lb (93 kg)    GENERAL:  No apparent distress, AOx3 HEENT:  No carotid bruits, +2 carotid impulses, no scleral icterus CAR: RRR no murmurs, gallops, rubs, or thrills RES:  Clear to auscultation bilaterally ABD:  Soft, nontender, nondistended, positive bowel sounds x 4 VASC:  +2 radial pulses, +2 carotid pulses, palpable pedal pulses NEURO:  CN 2-12 grossly intact; motor and sensory grossly intact PSYCH:  No active depression or anxiety EXT:  No edema, ecchymosis, or cyanosis  Signed, 11/25/21, MD  02/08/2022 4:27 PM    [facility] [address] [place], [place], [place]  [place] Phone: [phone]; Fax: [phone]   Note:  This document was prepared using Dragon voice recognition software and may  include unintentional dictation errors.

## 2022-02-08 ENCOUNTER — Ambulatory Visit: Payer: BC Managed Care – PPO | Attending: Cardiology | Admitting: Internal Medicine

## 2022-02-08 ENCOUNTER — Encounter: Payer: Self-pay | Admitting: Internal Medicine

## 2022-02-08 VITALS — BP 132/70 | HR 51 | Ht 73.0 in | Wt 215.6 lb

## 2022-02-08 DIAGNOSIS — I480 Paroxysmal atrial fibrillation: Secondary | ICD-10-CM

## 2022-02-08 NOTE — Patient Instructions (Signed)
Medication Instructions:  No changes *If you need a refill on your cardiac medications before your next appointment, please call your pharmacy*   Lab Work: none If you have labs (blood work) drawn today and your tests are completely normal, you will receive your results only by: [place] (if you have MyChart) OR A paper copy in the mail If you have any lab test that is abnormal or we need to change your treatment, we will call you to review the results.   Testing/Procedures: none   Follow-Up: At [facility], you and your health needs are our priority.  As part of our continuing mission to provide you with exceptional heart care, we have created designated Provider Care Teams.  These Care Teams include your primary Cardiologist (physician) and Advanced Practice Providers (APPs -  Physician Assistants and Nurse Practitioners) who all work together to provide you with the care you need, when you need it.  We recommend signing up for the patient portal called "MyChart".  Sign up information is provided on this After Visit Summary.  MyChart is used to connect with patients for Virtual Visits (Telemedicine).  Patients are able to view lab/test results, encounter notes, upcoming appointments, etc.  Non-urgent messages can be sent to your provider as well.   To learn more about what you can do with MyChart, go to [email or web address].    Your next appointment:   6 month(s)  The format for your next appointment:   In Person  Provider:   Early Osmond, MD      Important Information About Sugar

## 2022-06-12 DIAGNOSIS — L040 Acute lymphadenitis of face, head and neck: Secondary | ICD-10-CM | POA: Diagnosis not present

## 2022-06-16 ENCOUNTER — Emergency Department (HOSPITAL_BASED_OUTPATIENT_CLINIC_OR_DEPARTMENT_OTHER)
Admission: EM | Admit: 2022-06-16 | Discharge: 2022-06-16 | Disposition: A | Discharge status: Home / Self Care | Payer: BC Managed Care – PPO | Attending: Emergency Medicine | Admitting: Emergency Medicine

## 2022-06-16 ENCOUNTER — Encounter (HOSPITAL_BASED_OUTPATIENT_CLINIC_OR_DEPARTMENT_OTHER): Payer: Self-pay | Admitting: Emergency Medicine

## 2022-06-16 ENCOUNTER — Other Ambulatory Visit: Payer: Self-pay

## 2022-06-16 ENCOUNTER — Emergency Department (HOSPITAL_BASED_OUTPATIENT_CLINIC_OR_DEPARTMENT_OTHER): Payer: BC Managed Care – PPO

## 2022-06-16 DIAGNOSIS — I480 Paroxysmal atrial fibrillation: Secondary | ICD-10-CM | POA: Diagnosis not present

## 2022-06-16 DIAGNOSIS — R009 Unspecified abnormalities of heart beat: Secondary | ICD-10-CM | POA: Diagnosis not present

## 2022-06-16 DIAGNOSIS — I4891 Unspecified atrial fibrillation: Secondary | ICD-10-CM | POA: Diagnosis not present

## 2022-06-16 LAB — CBC
HCT: 48.8 % (ref 39.0–52.0)
Hemoglobin: 16.5 g/dL (ref 13.0–17.0)
MCH: 28.3 pg (ref 26.0–34.0)
MCHC: 33.8 g/dL (ref 30.0–36.0)
MCV: 83.6 fL (ref 80.0–100.0)
Platelets: 231 10*3/uL (ref 150–400)
RBC: 5.84 MIL/uL — ABNORMAL HIGH (ref 4.22–5.81)
RDW: 13.2 % (ref 11.5–15.5)
WBC: 6.3 10*3/uL (ref 4.0–10.5)
nRBC: 0 % (ref 0.0–0.2)

## 2022-06-16 LAB — MAGNESIUM: Magnesium: 2.1 mg/dL (ref 1.7–2.4)

## 2022-06-16 LAB — BASIC METABOLIC PANEL
Anion gap: 9 (ref 5–15)
BUN: 21 mg/dL (ref 8–23)
CO2: 27 mmol/L (ref 22–32)
Calcium: 10.1 mg/dL (ref 8.9–10.3)
Chloride: 105 mmol/L (ref 98–111)
Creatinine, Ser: 0.92 mg/dL (ref 0.61–1.24)
GFR, Estimated: >60 mL/min (ref >60)
Glucose, Bld: 100 mg/dL — ABNORMAL HIGH (ref 70–99)
Potassium: 4.2 mmol/L (ref 3.5–5.1)
Sodium: 141 mmol/L (ref 135–145)

## 2022-06-16 LAB — URIC ACID: Uric Acid, Serum: 5.4 mg/dL (ref 3.7–8.6)

## 2022-06-16 LAB — TROPONIN I (HIGH SENSITIVITY)
Troponin I (High Sensitivity): 17 ng/L (ref <18)
Troponin I (High Sensitivity): 15 ng/L (ref <18)

## 2022-06-16 MED ORDER — DILTIAZEM HCL 25 MG/5ML IV SOLN
20.0000 mg | Freq: Once | INTRAVENOUS | Status: AC
  Administered 2022-06-16: 20 mg via INTRAVENOUS
  Filled 2022-06-16: qty 5

## 2022-06-16 MED ORDER — MAGNESIUM SULFATE 2 GM/50ML IV SOLN
2.0000 g | Freq: Once | INTRAVENOUS | Status: AC
  Administered 2022-06-16: 2 g via INTRAVENOUS
  Filled 2022-06-16: qty 50

## 2022-06-16 MED ORDER — SODIUM CHLORIDE 0.9 % IV BOLUS
1000.0000 mL | Freq: Once | INTRAVENOUS | Status: AC
  Administered 2022-06-16: 1000 mL via INTRAVENOUS

## 2022-06-16 NOTE — Discharge Instructions (Addendum)
It was a pleasure caring for you today in the emergency department.  Please return to the emergency department for any worsening or worrisome symptoms.  Please follow up with your cardiologist in regards to your visit today. They should call in the next few days re: appointment, if you do not hear from them within 1-2 weeks please call office for further assistance

## 2022-06-16 NOTE — ED Notes (Signed)
He is now in sinus bradycardia with average heart rate of 48-54. He remains comfortable.

## 2022-06-16 NOTE — ED Provider Notes (Signed)
[place] Provider Note  CSN: [id number] Arrival date & time: 06/16/22 5009  Chief Complaint(s) Irregular Heart Beat  HPI Grant Knight is a 62 y.o. male with past medical history as below, significant for P A-fib, low back pain, who presents to the ED with complaint of irregular heartbeat.  Patient history of patient's A-fib, follows with Dr. Ali Lowe at [facility], managed with diltiazem, was previously on metoprolol but was stopped last year.  CHA2DS2-VASc of 0 so not anticoagulated.  Echocardiogram 6/23 with LVEF 66 5%, left ventricular hypertrophy, no diastolic dysfunction.  Patient reported to the ED today with irregular heartbeat. Patient reports that began around midnight last night after he got up to use the bathroom.  He took his PRN diltiazem which did not significantly improve his symptoms.  He has had sensation of fatigue, malaise since onset of A-fib.  Dull chest discomfort similar to prior episodes of A-fib.  He checked his Apple Watch and it did show an irregular heartbeat.  Call cardiology office and inquire whether or not he can take a dose of metoprolol as well and advise he return to the ER for evaluation. Normal state of health prior to symptom onset last pm.  No recent stimulant use, no illicit drug use such as cocaine. He exercises regularly.   Past Medical History Past Medical History:  Diagnosis Date   Atrial fibrillation ([place])    Cellulitis of right leg    Effusion of right knee    Lumbar back pain    Patient Active Problem List   Diagnosis Date Noted   Herpes zoster 05/27/2011   Home Medication(s) Prior to Admission medications   Medication Sig Start Date End Date Taking? Authorizing Provider  diltiazem (CARDIZEM) 30 MG tablet Take 1 tablet every 4 hours AS NEEDED for heart rate >100 as long as blood pressure >100. 01/04/22   Sherran Needs, NP  Omega-3 Fatty Acids (OMEGA-3 FISH OIL PO) Take 1 capsule by mouth every  morning.    [provider]                                                                                                                                    Past Surgical History Past Surgical History:  Procedure Laterality Date   SHOULDER SURGERY     Family History Family History  Problem Relation Age of Onset   Lung cancer Mother        deceased   Diabetes Mother    Diabetes Sister        deceased   Breast cancer Sister     Social History Social History   Tobacco Use   Smoking status: Never   Smokeless tobacco: Never  Substance Use Topics   Alcohol use: Not Currently   Drug use: No   Allergies Patient has no known allergies.  Review of Systems Review of Systems  Constitutional:  Negative for chills and fever.  HENT:  Negative for facial swelling and trouble swallowing.   Eyes:  Negative for photophobia and visual disturbance.  Respiratory:  Negative for cough and shortness of breath.   Cardiovascular:  Positive for palpitations. Negative for leg swelling.  Gastrointestinal:  Negative for abdominal pain, nausea and vomiting.  Endocrine: Negative for polydipsia and polyuria.  Genitourinary:  Negative for difficulty urinating and hematuria.  Musculoskeletal:  Negative for gait problem and joint swelling.  Skin:  Negative for pallor and rash.  Neurological:  Negative for syncope and headaches.  Psychiatric/Behavioral:  Negative for agitation and confusion.     Physical Exam Vital Signs  I have reviewed the triage vital signs BP 129/75   Pulse (!) 47   Temp 98.2 F (36.8 C) (Oral)   Resp 16   SpO2 100%  Physical Exam Vitals and nursing note reviewed.  Constitutional:      General: He is not in acute distress.    Appearance: Normal appearance. He is well-developed.  HENT:     Head: Normocephalic and atraumatic.     Right Ear: External ear normal.     Left Ear: External ear normal.     Mouth/Throat:     Mouth: Mucous membranes are moist.  Eyes:      General: No scleral icterus. Cardiovascular:     Rate and Rhythm: Tachycardia present. Rhythm irregular.     Pulses: Normal pulses.     Heart sounds: Normal heart sounds.  Pulmonary:     Effort: Pulmonary effort is normal. No respiratory distress.     Breath sounds: Normal breath sounds.  Abdominal:     General: Abdomen is flat.     Palpations: Abdomen is soft.     Tenderness: There is no abdominal tenderness.  Musculoskeletal:     Cervical back: No rigidity.     Right lower leg: No edema.     Left lower leg: No edema.  Skin:    General: Skin is warm and dry.     Capillary Refill: Capillary refill takes less than 2 seconds.  Neurological:     Mental Status: He is alert and oriented to person, place, and time.     GCS: GCS eye subscore is 4. GCS verbal subscore is 5. GCS motor subscore is 6.  Psychiatric:        Mood and Affect: Mood normal.        Behavior: Behavior normal.     ED Results and Treatments Labs (all labs ordered are listed, but only abnormal results are displayed) Labs Reviewed  BASIC METABOLIC PANEL - Abnormal; Notable for the following components:      Result Value   Glucose, Bld 100 (*)    All other components within normal limits  CBC - Abnormal; Notable for the following components:   RBC 5.84 (*)    All other components within normal limits  URIC ACID  MAGNESIUM  TROPONIN I (HIGH SENSITIVITY)  TROPONIN I (HIGH SENSITIVITY)  Radiology DG Chest Port 1 View  Result Date: 06/16/2022 CLINICAL DATA:  Atrial fibrillation. EXAM: PORTABLE CHEST 1 VIEW COMPARISON:  02/16/2012 FINDINGS: Normal cardiomediastinal contours. No signs of pleural effusion or edema. No airspace opacities identified. Visualized osseous structures are unremarkable. IMPRESSION: No active disease. Electronically Signed   By: Kerby Moors M.D.   On: 06/16/2022  10:25    Pertinent labs & imaging results that were available during my care of the patient were reviewed by me and considered in my medical decision making (see MDM for details).  Medications Ordered in ED Medications  sodium chloride 0.9 % bolus 1,000 mL (1,000 mLs Intravenous New Bag/Given 06/16/22 0946)  magnesium sulfate IVPB 2 g 50 mL (2 g Intravenous New Bag/Given 06/16/22 0952)  diltiazem (CARDIZEM) injection 20 mg (20 mg Intravenous Given 06/16/22 0949)                                                                                                                                     Procedures Procedures  (including critical care time)  Medical Decision Making / ED Course   MDM:  Grant Knight is a 62 y.o. male with past medical history as below, significant for P A-fib, low back pain, who presents to the ED with complaint of irregular heartbeat. . The complaint involves an extensive differential diagnosis and also carries with it a high risk of complications and morbidity.  Serious etiology was considered. Ddx includes but is not limited to:   On initial assessment the patient is: A-fib, SVT, dysrhythmia, electrolyte derangement, ACS, metabolic abnormality, etc. Vital signs and nursing notes were reviewed  Clinical Course as of 06/16/22 1319  Sat Jun 16, 2022  1242 On recheck pt reports feeling much better, he is now in sinus on the cardiac monitor. Reports his resting HR is around 50 at home. Labs are stable. Trop neg.  [SG]    Clinical Course User Index [SG] Jeanell Sparrow, DO   Well-appearing 62 year old male with history of PAF on as needed diltiazem to the ED with A-fib.  Patient in A-fib with RVR on initial assessment.  Blood pressure stable.  Screening labs reviewed which are stable.  Mg wnl. Lytes wnl. Troponin negative x 2.  Electrolytes stable.  Patient was given dose of IV diltiazem along with magnesium sulfate and IV fluids.  He converted to sinus rhythm on the ED.   Symptoms resolved.  His CHA2DS2-VASc is 0, does not require anticoagulation.  Recommendation follow-up with cardiologist in regards to further care of his PAF.  Consider EP referral.  Patient did request uric acid level be drawn because he is concerned he might of had gout in his ankle recently.  This was not elevated.  His ankle pain has resolved prior to today.  Symptoms today likely secondary to A-fib with RVR, ACS is unlikely.  HEART score is low.   The patient improved significantly and  was discharged in stable condition. Detailed discussions were had with the patient regarding current findings, and need for close f/u with PCP or on call doctor. The patient has been instructed to return immediately if the symptoms worsen in any way for re-evaluation. Patient verbalized understanding and is in agreement with current care plan. All questions answered prior to discharge.        Additional history obtained: -Additional history obtained from na -External records from outside source obtained and reviewed including: Chart review including previous notes, labs, imaging, consultation notes including home medications, prior labs/imaging/recent echo, primary care documentation   Lab Tests: -I ordered, reviewed, and interpreted labs.   The pertinent results include:   Labs Reviewed  BASIC METABOLIC PANEL - Abnormal; Notable for the following components:      Result Value   Glucose, Bld 100 (*)    All other components within normal limits  CBC - Abnormal; Notable for the following components:   RBC 5.84 (*)    All other components within normal limits  URIC ACID  MAGNESIUM  TROPONIN I (HIGH SENSITIVITY)  TROPONIN I (HIGH SENSITIVITY)    Notable for Labs are stable, troponin negative x 2  EKG   EKG Interpretation  Date/Time:  Saturday June 16 2022 10:11:14 EST Ventricular Rate:  69 PR Interval:    QRS Duration: 92 QT Interval:  383 QTC Calculation: 411 R Axis:   149 Text  Interpretation: Atrial fibrillation Interpretation limited secondary to artifact Confirmed by Tanda Rockers ([id number]) on 06/16/2022 1:17:41 PM         Imaging Studies ordered: I ordered imaging studies including cxr I independently visualized the following imaging with scope of interpretation limited to determining acute life threatening conditions related to emergency care: cxr was stable I independently visualized and interpreted imaging. I agree with the radiologist interpretation   Medicines ordered and prescription drug management: Meds ordered this encounter  Medications   sodium chloride 0.9 % bolus 1,000 mL   magnesium sulfate IVPB 2 g 50 mL   diltiazem (CARDIZEM) injection 20 mg    -I have reviewed the patients home medicines and have made adjustments as needed   Consultations Obtained: na   Cardiac Monitoring: The patient was maintained on a cardiac monitor.  I personally viewed and interpreted the cardiac monitored which showed an underlying rhythm of: afib rvr > sr  Social Determinants of Health:  Diagnosis or treatment significantly limited by social determinants of health: na   Reevaluation: After the interventions noted above, I reevaluated the patient and found that they have resolved  Co morbidities that complicate the patient evaluation  Past Medical History:  Diagnosis Date   Atrial fibrillation (HCC)    Cellulitis of right leg    Effusion of right knee    Lumbar back pain       Dispostion: Disposition decision including need for hospitalization was considered, and patient discharged from emergency department.    Final Clinical Impression(s) / ED Diagnoses Final diagnoses:  PAF (paroxysmal atrial fibrillation) (HCC)     This chart was dictated using voice recognition software.  Despite best efforts to proofread,  errors can occur which can change the documentation meaning.    Tanda Rockers A, DO 06/16/22 1319

## 2022-06-16 NOTE — ED Triage Notes (Signed)
Pt has hx afib since May, has had multiple episodes and he  has followed up with afib clinic. Pt is not on any maintenance meds. He has been instructed in the past to take metoprolol when this happens and it converted him each time. The md recently changed to cardizem prn. This was first time he took it  (1230am) and has not helped.

## 2022-06-16 NOTE — ED Notes (Signed)
He converted to sinus bradycardia (from a-fib) at 1222 hours.

## 2022-06-18 ENCOUNTER — Telehealth (HOSPITAL_COMMUNITY): Payer: Self-pay

## 2022-06-18 NOTE — Telephone Encounter (Signed)
Tried several times to get patient on phone. He will pick up the phone then disconnect the line.

## 2022-06-21 ENCOUNTER — Encounter (HOSPITAL_COMMUNITY): Payer: Self-pay | Admitting: *Deleted

## 2022-06-26 NOTE — Progress Notes (Unsigned)
Cardiology Office Note:    Date:  06/27/2022   ID:  Grant Knight, DOB 1961-03-02, MRN [id number]  PCP:  [facility] Cardiologist:  Lenna Sciara, MD Referring MD: [facility]   Chief Complaint/Reason for Referral: Paroxysmal atrial fibrillation  ASSESSMENT:    1. Paroxysmal atrial fibrillation (HCC)      PLAN:    In order of problems listed above: 1.  Paroxysmal atrial fibrillation: I discussed perhaps changing to daily long-acting Cardizem or Toprol.  The patient would like to avoid a daily medication.  We will continue him on diltiazem as needed.  He is interested in seeking more information about an ablation.  We will refer him to EP.      Dispo:  Return in about 6 months (around 12/26/2022).      Medication Adjustments/Labs and Tests Ordered: Current medicines are reviewed at length with the patient today.  Concerns regarding medicines are outlined above.  The following changes have been made:  no change   Labs/tests ordered: Orders Placed This Encounter  Procedures   Ambulatory referral to Cardiac Electrophysiology    Medication Changes: No orders of the defined types were placed in this encounter.    Current medicines are reviewed at length with the patient today.  The patient does not have concerns regarding medicines.   History of Present Illness:    FOCUSED PROBLEM LIST:   1.  Paroxysmal atrial fibrillation with CV 2 score of 0; followed by atrial fibrillation clinic  9/23 consultation:  The patient is a 62 y.o. male with the indicated medical history here for recommendations regarding paroxysmal atrial fibrillation.  It seems that he was seen in the atrial fibrillation clinic last month.  He was noted to have symptomatic atrial fibrillation with conversion to sinus rhythm after an extra dose of metoprolol.  At that visit he was prescribed Cardizem.    The patient tells me he has  developed atrial fibrillation relatively infrequently.  It seems to respond to as needed Cardizem.  He denies any presyncope, syncope, chest pain, peripheral edema, paroxysmal nocturnal dyspnea.  He is quite active and participates in CrossFit on a regular basis.  He has required no emergency room visits or hospitalizations.  Plan: Continue medical therapy.  Today: The patient was seen earlier this month emergency department due to palpitations and found to be in atrial fibrillation with rapid ventricular rate.  He was given IV diltiazem which converted him to normal sinus rhythm.  He is here for follow-up.  The patient tells me that he was taking glucosamine chondroitin sulfate around the time he developed rapid atrial fibrillation.  Since that time he has stayed away from this medication.  He does CrossFit on a regular basis without any exacerbation of his palpitations.  He is generally averse to taking medication on a daily basis.  His heart rates have generally been in the 50s.  He is otherwise well without significant complaints.         Current Medications: Current Meds  Medication Sig   diltiazem (CARDIZEM) 30 MG tablet Take 1 tablet every 4 hours AS NEEDED for heart rate >100 as long as blood pressure >100.   Omega-3 Fatty Acids (OMEGA-3 FISH OIL PO) Take 1 capsule by mouth every morning.     Allergies:    Patient has no known allergies.   Social History:   Social History   Tobacco Use   Smoking status:  Never   Smokeless tobacco: Never  Substance Use Topics   Alcohol use: Not Currently   Drug use: No     Family Hx: Family History  Problem Relation Age of Onset   Lung cancer Mother        deceased   Diabetes Mother    Diabetes Sister        deceased   Breast cancer Sister      Review of Systems:   Please see the history of present illness.    All other systems reviewed and are negative.     EKGs/Labs/Other Test Reviewed:    EKG:  EKG performed August 2023 that I  personally reviewed demonstrates sinus bradycardia with inferior infarction pattern.  Prior CV studies:  TTE 2023  1. Left ventricular ejection fraction, by estimation, is 60 to 65%. The  left ventricle has normal function. The left ventricle has no regional  wall motion abnormalities. There is mild left ventricular hypertrophy.  Left ventricular diastolic parameters  were normal.   2. Right ventricular systolic function is normal. The right ventricular  size is mildly enlarged. There is normal pulmonary artery systolic  pressure.   3. Left atrial size was mild to moderately dilated.   4. Right atrial size was mildly dilated.   5. The mitral valve is normal in structure. No evidence of mitral valve  regurgitation.   6. The aortic valve is normal in structure. Aortic valve regurgitation is  not visualized.   7. Aortic no significant ascending aortic aneurysm.   8. The inferior vena cava is dilated in size with >50% respiratory  variability, suggesting right atrial pressure of 8 mmHg.   Other studies Reviewed: Review of the additional studies/records demonstrates: CT abdomen pelvis 2013 without aortic atherosclerosis  Recent Labs: 09/27/2021: TSH 1.335 06/16/2022: BUN 21; Creatinine, Ser 0.92; Hemoglobin 16.5; Magnesium 2.1; Platelets 231; Potassium 4.2; Sodium 141   Recent Lipid Panel No results found for: "CHOL", "TRIG", "HDL", "LDLCALC", "LDLDIRECT"  Risk Assessment/Calculations:     CHA2DS2-VASc Score = 0   This indicates a 0.2% annual risk of stroke. The patient's score is based upon: CHF History: 0 HTN History: 0 Diabetes History: 0 Stroke History: 0 Vascular Disease History: 0 Age Score: 0 Gender Score: 0               Physical Exam:    VS:  BP 130/80   Pulse (!) 55   Ht 6\' 1"  (1.854 m)   Wt 222 lb 12.8 oz (101.1 kg)   SpO2 100%   BMI 29.39 kg/m    Wt Readings from Last 3 Encounters:  06/27/22 222 lb 12.8 oz (101.1 kg)  02/08/22 215 lb 9.6 oz (97.8 kg)   01/04/22 213 lb (96.6 kg)    GENERAL:  No apparent distress, AOx3 HEENT:  No carotid bruits, +2 carotid impulses, no scleral icterus CAR: RRR no murmurs, gallops, rubs, or thrills RES:  Clear to auscultation bilaterally ABD:  Soft, nontender, nondistended, positive bowel sounds x 4 VASC:  +2 radial pulses, +2 carotid pulses, palpable pedal pulses NEURO:  CN 2-12 grossly intact; motor and sensory grossly intact PSYCH:  No active depression or anxiety EXT:  No edema, ecchymosis, or cyanosis  Signed, Early Osmond, MD  06/27/2022 8:52 AM    [address], [place], [place]  [address] Phone: [phone]; Fax: [phone]   Note:  This document was prepared using Dragon voice recognition software and  may include unintentional dictation errors.

## 2022-06-27 ENCOUNTER — Encounter: Payer: Self-pay | Admitting: Internal Medicine

## 2022-06-27 ENCOUNTER — Ambulatory Visit: Payer: BC Managed Care – PPO | Attending: Internal Medicine | Admitting: Internal Medicine

## 2022-06-27 VITALS — BP 130/80 | HR 55 | Ht 73.0 in | Wt 222.8 lb

## 2022-06-27 DIAGNOSIS — I480 Paroxysmal atrial fibrillation: Secondary | ICD-10-CM | POA: Diagnosis not present

## 2022-06-27 NOTE — Patient Instructions (Signed)
Medication Instructions:  Your physician recommends that you continue on your current medications as directed. Please refer to the Current Medication list given to you today.  *If you need a refill on your cardiac medications before your next appointment, please call your pharmacy*   Follow-Up: At [facility], you and your health needs are our priority.  As part of our continuing mission to provide you with exceptional heart care, we have created designated Provider Care Teams.  These Care Teams include your primary Cardiologist (physician) and Advanced Practice Providers (APPs -  Physician Assistants and Nurse Practitioners) who all work together to provide you with the care you need, when you need it.   Your next appointment:   6 month(s)  Provider:   APP  Other Instructions Referral to Dr Myles Gip for Afib

## 2022-07-20 DIAGNOSIS — Z23 Encounter for immunization: Secondary | ICD-10-CM | POA: Diagnosis not present

## 2022-07-20 DIAGNOSIS — H539 Unspecified visual disturbance: Secondary | ICD-10-CM | POA: Diagnosis not present

## 2022-07-20 DIAGNOSIS — Z0000 Encounter for general adult medical examination without abnormal findings: Secondary | ICD-10-CM | POA: Diagnosis not present

## 2022-07-20 DIAGNOSIS — Z1211 Encounter for screening for malignant neoplasm of colon: Secondary | ICD-10-CM | POA: Diagnosis not present

## 2022-07-20 DIAGNOSIS — I480 Paroxysmal atrial fibrillation: Secondary | ICD-10-CM | POA: Diagnosis not present

## 2022-07-30 ENCOUNTER — Ambulatory Visit: Payer: BC Managed Care – PPO | Attending: Cardiovascular Disease | Admitting: Cardiovascular Disease

## 2022-07-30 ENCOUNTER — Encounter: Payer: Self-pay | Admitting: Cardiovascular Disease

## 2022-07-30 VITALS — HR 54 | Ht 73.0 in | Wt 223.6 lb

## 2022-07-30 DIAGNOSIS — R001 Bradycardia, unspecified: Secondary | ICD-10-CM | POA: Diagnosis not present

## 2022-07-30 DIAGNOSIS — I480 Paroxysmal atrial fibrillation: Secondary | ICD-10-CM

## 2022-07-30 MED ORDER — FLECAINIDE ACETATE 50 MG PO TABS: 50.0000 mg | ORAL_TABLET | ORAL | 0 refills | Status: DC | PRN

## 2022-07-30 NOTE — Patient Instructions (Addendum)
Medication Instructions:  Your physician has recommended you make the following change in your medication:   1) You will start apixaban (Eliquis) 5mg  twice daily 2 weeks prior to your ablation and continue for 3 months after your ablation. We will call you and send this in when it is closer to your procedure date. 2) START flecainide 50mg  as needed   *If you need a refill on your cardiac medications before your next appointment, please call your pharmacy*   Lab Work: 1 week prior to CT scan: CBC, BMET (you do not need to be fasting) You may come to the lab any time between 7:30am and 4:30pm. If you have labs (blood work) drawn today and your tests are completely normal, you will receive your results only by: [place] (if you have MyChart) OR A paper copy in the mail If you have any lab test that is abnormal or we need to change your treatment, we will call you to review the results.   Testing/Procedures: Your physician has requested that you have cardiac CT. Cardiac computed tomography (CT) is a painless test that uses an x-ray machine to take clear, detailed pictures of your heart. For further information please visit [facility]. Please follow instruction sheet as given.   Your physician has recommended that you have an ablation. Catheter ablation is a medical procedure used to treat some cardiac arrhythmias (irregular heartbeats). During catheter ablation, a long, thin, flexible tube is put into a blood vessel in your groin (upper thigh), or neck. This tube is called an ablation catheter. It is then guided to your heart through the blood vessel. Radio frequency waves destroy small areas of heart tissue where abnormal heartbeats may cause an arrhythmia to start.    The EP procedure scheduler, April G, will call you to schedule your procedure and review instructions with you. Please allow 2-3 weeks for her to contact you.     Follow-Up: At [facility], you and your  health needs are our priority.  As part of our continuing mission to provide you with exceptional heart care, we have created designated Provider Care Teams.  These Care Teams include your primary Cardiologist (physician) and Advanced Practice Providers (APPs -  Physician Assistants and Nurse Practitioners) who all work together to provide you with the care you need, when you need it.   Your next appointment:   4 week(s) after your ablation   Provider:   You will follow up in the [facility] located at [facility]. Your provider will be: Roderic Palau, NP or Clint R. Fenton, PA-C

## 2022-07-30 NOTE — Progress Notes (Signed)
Electrophysiology Office Note:    Date:  07/30/2022   ID:  Grant Knight, DOB 11-14-1960, MRN [id number]  PCP:  [name] Cardiologist:  Early Osmond, MD     Referring MD: Early Osmond, MD   History of Present Illness:    Grant Knight is a 62 y.o. male with little past medical history, referred for arrhythmia management.  He first presented with palpitations in May 2023.  Per ER noted, ECG showed a regular narrow complex tachycardia. All ECGs available in epic show an irregular rhythm. Vagal maneuvers had no effect.  With IV fluids, the heart rate slowed, revealing atrial fibrillation with RVR.  He was discharged in atrial fibrillation on Toprol.  He was seen in A-fib clinic the following day in sinus rhythm with a heart rate of 40 bpm.  He was instructed to take the metoprolol as needed.    A second episode of atrial fibrillation converted to sinus rhythm after he took metoprolol.  He was again seen in the ER in February for recurrent palpitations.  He converted to sinus rhythm with IV diltiazem.  Today, he feels well.  No chest pain, recent palpitations, orthopnea, syncope.  Past Medical History:  Diagnosis Date   Atrial fibrillation ([place])    Cellulitis of right leg    Effusion of right knee    Lumbar back pain     Past Surgical History:  Procedure Laterality Date   SHOULDER SURGERY      Current Medications: Current Meds  Medication Sig   diltiazem (CARDIZEM) 30 MG tablet Take 1 tablet every 4 hours AS NEEDED for heart rate >100 as long as blood pressure >100.     Allergies:   Patient has no known allergies.   Social and Family History: Reviewed in Epic  ROS:   Please see the history of present illness.    All other systems reviewed and are negative.  EKGs/Labs/Other Studies Reviewed Today:    Cardiac Studies & Procedures       ECHOCARDIOGRAM  ECHOCARDIOGRAM COMPLETE 10/12/2021   1. Left  ventricular ejection fraction, by estimation, is 60 to 65%. The left ventricle has normal function. The left ventricle has no regional wall motion abnormalities. There is mild left ventricular hypertrophy. Left ventricular diastolic parameters were normal. 2. Right ventricular systolic function is normal. The right ventricular size is mildly enlarged. There is normal pulmonary artery systolic pressure. 3. Left atrial size was mild to moderately dilated. 4. Right atrial size was mildly dilated. 5. The mitral valve is normal in structure. No evidence of mitral valve regurgitation. 6. The aortic valve is normal in structure. Aortic valve regurgitation is not visualized. 7. Aortic no significant ascending aortic aneurysm. 8. The inferior vena cava is dilated in size with >50% respiratory variability, suggesting right atrial pressure of 8 mmHg.              EKG:  Last EKG results: 2/12 (reviewed by me) AF with RVR I personally reviewed all 7 ECGs available in [facility]. They show sinus bradycardia and AF rith RVR.  Recent Labs: 09/27/2021: TSH 1.335 06/16/2022: BUN 21; Creatinine, Ser 0.92; Hemoglobin 16.5; Magnesium 2.1; Platelets 231; Potassium 4.2; Sodium 141     Physical Exam:    VS:  Pulse (!) 54   Ht 6\' 1"  (1.854 m)   Wt 223 lb 9.6 oz (101.4 kg)   SpO2 98%   BMI 29.50 kg/m  Wt Readings from Last 3 Encounters:  07/30/22 223 lb 9.6 oz (101.4 kg)  06/27/22 222 lb 12.8 oz (101.1 kg)  02/08/22 215 lb 9.6 oz (97.8 kg)     GEN: Well nourished, well developed in no acute distress CARDIAC: RRR, no murmurs, rubs, gallops RESPIRATORY:  Normal work of breathing MUSCULOSKELETAL: no edema    ASSESSMENT & PLAN:    Paroxysmal atrial fibrillation - highly symptomatic, recurrences have resulted in ER visits. - we discussed management options.  He prefers an early invasive strategy. - Flecainide 50 mg as needed for recurrence pending ablation.  I instructed him to take metoprolol 30 minutes  prior to to taking flecainide. - CHADS2VASc is 0  We discussed the indication, rationale, logistics, anticipated benefits, and potential risks of the ablation procedure including but not limited to -- bleed at the groin access site, chest pain, damage to nearby organs such as the diaphragm, lungs, or esophagus, need for a drainage tube, or prolonged hospitalization. I explained that the risk for stroke, heart attack, need for open chest surgery, or even death is very low but not zero. he  expressed understanding and wishes to proceed.   Sinus bradycardia - asymptomatic but limits medication options          Medication Adjustments/Labs and Tests Ordered: Current medicines are reviewed at length with the patient today.  Concerns regarding medicines are outlined above.  No orders of the defined types were placed in this encounter.  No orders of the defined types were placed in this encounter.    Signed, Melida Quitter, MD  07/30/2022 8:34 AM    [place]

## 2022-07-31 ENCOUNTER — Telehealth: Payer: Self-pay

## 2022-07-31 DIAGNOSIS — I480 Paroxysmal atrial fibrillation: Secondary | ICD-10-CM

## 2022-07-31 NOTE — Telephone Encounter (Signed)
Pt is scheduled for 12/19/22...  Lab appt: 12/05/22...  I will put Eliquis samples and copay card at front desk for pt and he will pick up when he comes in for labs. He is aware that he will start Eliquis on 12/05/22.   Instructions will be mailed to pt per his request after all is scheduled.

## 2022-08-02 ENCOUNTER — Ambulatory Visit: Payer: BC Managed Care – PPO | Admitting: Internal Medicine

## 2022-09-13 ENCOUNTER — Telehealth: Payer: Self-pay

## 2022-09-13 NOTE — Telephone Encounter (Signed)
LM for pt to call back. Need to move his procedure date from 8/7 to 8/1 due to Anesthesia needs.

## 2022-11-21 ENCOUNTER — Telehealth: Payer: Self-pay

## 2022-11-21 MED ORDER — APIXABAN 5 MG PO TABS: 5.0000 mg | ORAL_TABLET | Freq: Two times a day (BID) | ORAL | 3 refills | Status: DC

## 2022-11-21 NOTE — Telephone Encounter (Signed)
Spoke with patient about eliquis to be started on 12/20/22 - 3 weeks prior to ablation. Sent in prescription to patient pharmacy. No questions at this time

## 2022-11-28 ENCOUNTER — Other Ambulatory Visit: Payer: BC Managed Care – PPO

## 2022-12-05 ENCOUNTER — Other Ambulatory Visit: Payer: BC Managed Care – PPO

## 2022-12-06 ENCOUNTER — Other Ambulatory Visit (HOSPITAL_COMMUNITY): Payer: BC Managed Care – PPO

## 2022-12-12 ENCOUNTER — Other Ambulatory Visit (HOSPITAL_COMMUNITY): Payer: BC Managed Care – PPO

## 2022-12-20 ENCOUNTER — Telehealth: Payer: Self-pay

## 2022-12-20 NOTE — Telephone Encounter (Signed)
Attempted to contact patient. Patient cancelled ablation, just wanted to see if patient would like to schedule a follow up appointment to keep established.

## 2022-12-26 ENCOUNTER — Other Ambulatory Visit: Payer: BC Managed Care – PPO

## 2023-01-04 ENCOUNTER — Other Ambulatory Visit (HOSPITAL_COMMUNITY): Payer: BC Managed Care – PPO

## 2023-01-11 ENCOUNTER — Ambulatory Visit (HOSPITAL_COMMUNITY): Admit: 2023-01-11 | Payer: BC Managed Care – PPO | Admitting: Cardiovascular Disease

## 2023-01-11 ENCOUNTER — Encounter (HOSPITAL_COMMUNITY): Payer: Self-pay

## 2023-01-11 SURGERY — ATRIAL FIBRILLATION ABLATION
Anesthesia: General

## 2023-06-06 ENCOUNTER — Telehealth: Payer: Self-pay

## 2023-06-06 NOTE — Telephone Encounter (Signed)
Attempted to contact patient, left message to call office back if wanted to pursue ablation further.

## 2023-08-16 DIAGNOSIS — K1121 Acute sialoadenitis: Secondary | ICD-10-CM | POA: Diagnosis not present

## 2023-12-03 DIAGNOSIS — M7918 Myalgia, other site: Secondary | ICD-10-CM | POA: Diagnosis not present

## 2023-12-03 DIAGNOSIS — M9904 Segmental and somatic dysfunction of sacral region: Secondary | ICD-10-CM | POA: Diagnosis not present

## 2023-12-03 DIAGNOSIS — M25651 Stiffness of right hip, not elsewhere classified: Secondary | ICD-10-CM | POA: Diagnosis not present

## 2023-12-03 DIAGNOSIS — M9905 Segmental and somatic dysfunction of pelvic region: Secondary | ICD-10-CM | POA: Diagnosis not present

## 2023-12-03 DIAGNOSIS — M9903 Segmental and somatic dysfunction of lumbar region: Secondary | ICD-10-CM | POA: Diagnosis not present

## 2023-12-05 DIAGNOSIS — M9904 Segmental and somatic dysfunction of sacral region: Secondary | ICD-10-CM | POA: Diagnosis not present

## 2023-12-05 DIAGNOSIS — M9903 Segmental and somatic dysfunction of lumbar region: Secondary | ICD-10-CM | POA: Diagnosis not present

## 2023-12-05 DIAGNOSIS — M9905 Segmental and somatic dysfunction of pelvic region: Secondary | ICD-10-CM | POA: Diagnosis not present

## 2023-12-05 DIAGNOSIS — M7918 Myalgia, other site: Secondary | ICD-10-CM | POA: Diagnosis not present

## 2023-12-05 DIAGNOSIS — M25651 Stiffness of right hip, not elsewhere classified: Secondary | ICD-10-CM | POA: Diagnosis not present

## 2023-12-13 DIAGNOSIS — M9903 Segmental and somatic dysfunction of lumbar region: Secondary | ICD-10-CM | POA: Diagnosis not present

## 2023-12-13 DIAGNOSIS — M9905 Segmental and somatic dysfunction of pelvic region: Secondary | ICD-10-CM | POA: Diagnosis not present

## 2023-12-13 DIAGNOSIS — M25651 Stiffness of right hip, not elsewhere classified: Secondary | ICD-10-CM | POA: Diagnosis not present

## 2023-12-13 DIAGNOSIS — M7918 Myalgia, other site: Secondary | ICD-10-CM | POA: Diagnosis not present

## 2023-12-13 DIAGNOSIS — M9904 Segmental and somatic dysfunction of sacral region: Secondary | ICD-10-CM | POA: Diagnosis not present

## 2024-04-25 ENCOUNTER — Emergency Department (HOSPITAL_BASED_OUTPATIENT_CLINIC_OR_DEPARTMENT_OTHER)
Admission: EM | Admit: 2024-04-25 | Discharge: 2024-04-25 | Disposition: A | Discharge status: Home / Self Care | Attending: Emergency Medicine | Admitting: Emergency Medicine

## 2024-04-25 ENCOUNTER — Emergency Department (HOSPITAL_BASED_OUTPATIENT_CLINIC_OR_DEPARTMENT_OTHER)

## 2024-04-25 DIAGNOSIS — I4892 Unspecified atrial flutter: Secondary | ICD-10-CM | POA: Diagnosis not present

## 2024-04-25 DIAGNOSIS — Z7901 Long term (current) use of anticoagulants: Secondary | ICD-10-CM | POA: Diagnosis not present

## 2024-04-25 DIAGNOSIS — R7309 Other abnormal glucose: Secondary | ICD-10-CM | POA: Insufficient documentation

## 2024-04-25 DIAGNOSIS — R718 Other abnormality of red blood cells: Secondary | ICD-10-CM | POA: Insufficient documentation

## 2024-04-25 DIAGNOSIS — D72829 Elevated white blood cell count, unspecified: Secondary | ICD-10-CM | POA: Insufficient documentation

## 2024-04-25 DIAGNOSIS — R079 Chest pain, unspecified: Secondary | ICD-10-CM | POA: Diagnosis not present

## 2024-04-25 DIAGNOSIS — Z79899 Other long term (current) drug therapy: Secondary | ICD-10-CM | POA: Diagnosis not present

## 2024-04-25 DIAGNOSIS — R0602 Shortness of breath: Secondary | ICD-10-CM | POA: Diagnosis not present

## 2024-04-25 LAB — CBC WITH DIFFERENTIAL/PLATELET
Abs Immature Granulocytes: 0.06 K/uL (ref 0.00–0.07)
Basophils Absolute: 0.1 K/uL (ref 0.0–0.1)
Basophils Relative: 0 %
Eosinophils Absolute: 0.1 K/uL (ref 0.0–0.5)
Eosinophils Relative: 1 %
HCT: 50.1 % (ref 39.0–52.0)
Hemoglobin: 17.2 g/dL — ABNORMAL HIGH (ref 13.0–17.0)
Immature Granulocytes: 1 %
Lymphocytes Relative: 10 %
Lymphs Abs: 1.2 K/uL (ref 0.7–4.0)
MCH: 28.9 pg (ref 26.0–34.0)
MCHC: 34.3 g/dL (ref 30.0–36.0)
MCV: 84.2 fL (ref 80.0–100.0)
Monocytes Absolute: 0.8 K/uL (ref 0.1–1.0)
Monocytes Relative: 7 %
Neutro Abs: 9.2 K/uL — ABNORMAL HIGH (ref 1.7–7.7)
Neutrophils Relative %: 81 %
Platelets: 262 K/uL (ref 150–400)
RBC: 5.95 MIL/uL — ABNORMAL HIGH (ref 4.22–5.81)
RDW: 13.1 % (ref 11.5–15.5)
WBC: 11.4 K/uL — ABNORMAL HIGH (ref 4.0–10.5)
nRBC: 0 % (ref 0.0–0.2)

## 2024-04-25 LAB — COMPREHENSIVE METABOLIC PANEL WITH GFR
ALT: 30 U/L (ref 0–44)
AST: 32 U/L (ref 15–41)
Albumin: 4.5 g/dL (ref 3.5–5.0)
Alkaline Phosphatase: 136 U/L — ABNORMAL HIGH (ref 38–126)
Anion gap: 9 (ref 5–15)
BUN: 24 mg/dL — ABNORMAL HIGH (ref 8–23)
CO2: 28 mmol/L (ref 22–32)
Calcium: 10.4 mg/dL — ABNORMAL HIGH (ref 8.9–10.3)
Chloride: 101 mmol/L (ref 98–111)
Creatinine, Ser: 1.03 mg/dL (ref 0.61–1.24)
GFR, Estimated: >60 mL/min (ref >60)
Glucose, Bld: 145 mg/dL — ABNORMAL HIGH (ref 70–99)
Potassium: 4.5 mmol/L (ref 3.5–5.1)
Sodium: 138 mmol/L (ref 135–145)
Total Bilirubin: 0.8 mg/dL (ref 0.0–1.2)
Total Protein: 7.7 g/dL (ref 6.5–8.1)

## 2024-04-25 MED ORDER — METOPROLOL SUCCINATE ER 25 MG PO TB24: 25.0000 mg | ORAL_TABLET | Freq: Every day | ORAL | 0 refills | Status: AC

## 2024-04-25 MED ORDER — FLECAINIDE ACETATE 50 MG PO TABS: 50.0000 mg | ORAL_TABLET | ORAL | 0 refills | Status: AC | PRN

## 2024-04-25 MED ORDER — DILTIAZEM HCL 25 MG/5ML IV SOLN
10.0000 mg | Freq: Once | INTRAVENOUS | Status: AC
  Administered 2024-04-25: 10 mg via INTRAVENOUS
  Filled 2024-04-25: qty 5

## 2024-04-25 MED ORDER — DILTIAZEM HCL 30 MG PO TABS: ORAL_TABLET | ORAL | 0 refills | Status: AC

## 2024-04-25 MED ORDER — SODIUM CHLORIDE 0.9 % IV SOLN: Freq: Once | INTRAVENOUS | Status: AC

## 2024-04-25 MED ORDER — SODIUM CHLORIDE 0.9 % IV BOLUS
1000.0000 mL | Freq: Once | INTRAVENOUS | Status: AC
  Administered 2024-04-25: 1000 mL via INTRAVENOUS

## 2024-04-25 MED ORDER — METOPROLOL TARTRATE 25 MG PO TABS
25.0000 mg | ORAL_TABLET | Freq: Once | ORAL | Status: AC
  Administered 2024-04-25: 25 mg via ORAL
  Filled 2024-04-25: qty 1

## 2024-04-25 MED ORDER — APIXABAN 5 MG PO TABS: 5.0000 mg | ORAL_TABLET | Freq: Two times a day (BID) | ORAL | 3 refills | Status: AC

## 2024-04-25 MED ORDER — METOPROLOL TARTRATE 5 MG/5ML IV SOLN
5.0000 mg | Freq: Once | INTRAVENOUS | Status: AC
  Administered 2024-04-25: 5 mg via INTRAVENOUS
  Filled 2024-04-25: qty 5

## 2024-04-25 MED ORDER — MAGNESIUM SULFATE 2 GM/50ML IV SOLN
2.0000 g | Freq: Once | INTRAVENOUS | Status: AC
  Administered 2024-04-25: 2 g via INTRAVENOUS
  Filled 2024-04-25: qty 50

## 2024-04-25 NOTE — ED Provider Notes (Signed)
 [place] EMERGENCY DEPARTMENT AT [facility] Provider Note   CSN: [id number] Arrival date & time: 04/25/24  9144     Patient presents with: Chest Pain   Grant Knight is a 63 y.o. male.   63 year old male with history of paroxysmal a fib presents with feeling SHOB with exertion and palpitations. Onset around 10pm last night when going to bed. Patient is not on daily meds for afib, took Metoprolol  at 12:00 (midnight) as instructed the last time he had an incident of a fib with plan to take flecainide  an hour later if symptoms persisted.  Patient fell asleep on the sofa and when he woke up at 5 AM and felt his symptoms persisting, he took the flecainide  at that time.  Woke up this morning with ongoing symptoms and presented to the emergency room.  He states he is short of breath with exertion only, denies any chest pain.  No recent illness.  No other complaints or concerns.       Prior to Admission medications  Medication Sig Start Date End Date Taking? Authorizing Provider  metoprolol  succinate (TOPROL -XL) 25 MG 24 hr tablet Take 1 tablet (25 mg total) by mouth daily. Take at onset of symptoms 04/25/24  Yes Beverley Leita LABOR, PA-C  apixaban  (ELIQUIS ) 5 MG TABS tablet Take 1 tablet (5 mg total) by mouth 2 (two) times daily. Start on 04/25/24 04/25/24   Beverley Leita LABOR, PA-C  diltiazem  (CARDIZEM ) 30 MG tablet Take 1 tablet every 4 hours AS NEEDED for heart rate >100 as long as blood pressure >100. 04/25/24   Beverley Leita LABOR, PA-C  flecainide  (TAMBOCOR ) 50 MG tablet Take 1 tablet (50 mg total) by mouth as needed (palpitations/episodes of a-fib). 04/25/24   Beverley Leita LABOR, PA-C    Allergies: Patient has no known allergies.    Review of Systems Negative except as per HPI Updated Vital Signs BP 100/89   Pulse 71   Temp 98.6 F (37 C) (Oral)   Resp 20   SpO2 97%   Physical Exam Vitals and nursing note reviewed.  Constitutional:      General: He is not in acute distress.     Appearance: He is well-developed. He is not diaphoretic.  HENT:     Head: Normocephalic and atraumatic.  Cardiovascular:     Rate and Rhythm: Tachycardia present. Rhythm irregular.     Heart sounds: Normal heart sounds. No murmur heard. Pulmonary:     Effort: Pulmonary effort is normal.     Breath sounds: Normal breath sounds.  Musculoskeletal:     Right lower leg: No tenderness. No edema.     Left lower leg: No tenderness. No edema.  Skin:    General: Skin is warm and dry.  Neurological:     Mental Status: He is alert and oriented to person, place, and time.  Psychiatric:        Behavior: Behavior normal.     (all labs ordered are listed, but only abnormal results are displayed) Labs Reviewed  COMPREHENSIVE METABOLIC PANEL WITH GFR - Abnormal; Notable for the following components:      Result Value   Glucose, Bld 145 (*)    BUN 24 (*)    Calcium 10.4 (*)    Alkaline Phosphatase 136 (*)    All other components within normal limits  CBC WITH DIFFERENTIAL/PLATELET - Abnormal; Notable for the following components:   WBC 11.4 (*)    RBC 5.95 (*)  Hemoglobin 17.2 (*)    Neutro Abs 9.2 (*)    All other components within normal limits    EKG: EKG Interpretation Date/Time:  Saturday April 25 2024 09:10:38 EST Ventricular Rate:  146 PR Interval:  98 QRS Duration:  90 QT Interval:  322 QTC Calculation: 502 R Axis:   158  Text Interpretation: Sinus or ectopic atrial tachycardia Abnormal R-wave progression, late transition Inferior infarct, old Prolonged QT interval No significant change since last tracing Confirmed by Emil Share [phone]) on 04/25/2024 9:14:31 AM  Radiology: [facility] Chest Port 1 View Result Date: 04/25/2024 CLINICAL DATA:  Shortness of breath and chest pain EXAM: PORTABLE CHEST 1 VIEW COMPARISON:  06/16/2022 FINDINGS: The lungs are clear without focal pneumonia, edema, pneumothorax or pleural effusion. Cardiopericardial silhouette is at upper limits of normal  for size. No acute bony abnormality. Telemetry leads overlie the chest. IMPRESSION: No active disease. Electronically Signed   By: Camellia Candle M.D.   On: 04/25/2024 10:06     .Critical Care  Performed by: Beverley Leita LABOR, PA-C Authorized by: Beverley Leita LABOR, PA-C   Critical care provider statement:    Critical care time (minutes):  30   Critical care was time spent personally by me on the following activities:  Development of treatment plan with patient or surrogate, discussions with consultants, evaluation of patient's response to treatment, examination of patient, ordering and review of laboratory studies, ordering and review of radiographic studies, ordering and performing treatments and interventions, pulse oximetry, re-evaluation of patient's condition and review of old charts    Medications Ordered in the ED  sodium chloride  0.9 % bolus 1,000 mL (0 mLs Intravenous Stopped 04/25/24 1059)  metoprolol  tartrate (LOPRESSOR ) injection 5 mg (5 mg Intravenous Given 04/25/24 0919)  metoprolol  tartrate (LOPRESSOR ) injection 5 mg (5 mg Intravenous Given 04/25/24 1027)  metoprolol  tartrate (LOPRESSOR ) tablet 25 mg (25 mg Oral Given 04/25/24 1028)  0.9 %  sodium chloride  infusion ( Intravenous New Bag/Given 04/25/24 1103)  diltiazem  (CARDIZEM ) injection 10 mg (10 mg Intravenous Given 04/25/24 1109)  magnesium  sulfate IVPB 2 g 50 mL (0 g Intravenous Stopped 04/25/24 1213)                                    Medical Decision Making Amount and/or Complexity of Data Reviewed Labs: ordered. Radiology: ordered.  Risk Prescription drug management.   This patient presents to the ED for concern of shortness of breath and palpitations, this involves an extensive number of treatment options, and is a complaint that carries with it a high risk of complications and morbidity.  The differential diagnosis includes arrhythmia, PE, metabolic or electrolyte derangement, pneumonia   Co morbidities /  Chronic conditions that complicate the patient evaluation  Paroxysmal A-fib, not currently on any daily medications   Additional history obtained:  Additional history obtained from EMR External records from outside source obtained and reviewed including prior note from cardiology dated 07/30/22 May 2023 with palpitations, given IVF and rate slowed to reveal a fib RVR and dc on Torol 2nd episode of a fib, converted to sinus after taking Toprol  Feb 2024 ER with a fib and given IV Diltiazem     Lab Tests:  I Ordered, and personally interpreted labs.  The pertinent results include: CBC with nonspecific mild leukocytosis at 11.4.  Hemoglobin mildly elevated at 17.2.  CMP with elevated glucose at 145, nonfasting.   Imaging Studies  ordered:  I ordered imaging studies including chest x-ray I independently visualized and interpreted imaging which showed no acute process I agree with the radiologist interpretation   Cardiac Monitoring: / EKG:  The patient was maintained on a cardiac monitor.  I personally viewed and interpreted the cardiac monitored which showed an underlying rhythm of: Tachycardia with a rate of 146   Problem List / ED Course / Critical interventions / Medication management  63 year old male with history of paroxysmal A-fib presents to the ER with heart rate in the 140s.  Initial blood pressure in the 90s.  Complaint of shortness of breath with exertion, no chest pain.  Patient is provided with IV medications as listed below, converted to sinus rhythm.  Was discharged with Eliquis  for CHA2DS2-VASc or of 1, requests refill of his metoprolol , Cardizem , flecainide  as these medications have provide expired.  Plan is to follow-up with cardiology, provided with referral to A-fib clinic and advised to call his cardiologist tomorrow morning.  Return to the emergency room for any worsening or concerning symptoms. CHADSVASC score of 1 I ordered medication including IV fluids,  metoprolol , Cardizem , magnesium  Reevaluation of the patient after these medicines showed that the patient rate slowed after initial IV dose of metoprolol , appears to be in a flutter.  Rate returned back to 120s, 130s.  Discussed cardioversion, patient declines.  Provided with additional IV metoprolol  as well as p.o. metoprolol .  Rate persisted in the 120s.  Was provided with IV Cardizem , IV magnesium .  Converted to sinus rhythm with rate in the 70s. I have reviewed the patients home medicines and have made adjustments as needed   Consultations Obtained:  I requested consultation with the ER attending, Dr. Emil,  and discussed lab and imaging findings as well as pertinent plan - they recommend: assisted with medication management and planning, has seen the patient    Social Determinants of Health:  Lives with family, has PCP and cardiologist    Test / Admission - Considered:  Stable for dc      Final diagnoses:  Atrial flutter, unspecified type [facility])    ED Discharge Orders          Ordered    Amb referral to AFIB Clinic        04/25/24 1201    apixaban  (ELIQUIS ) 5 MG TABS tablet  2 times daily       Note to Pharmacy: Please Use 30 day free trial card [facility] PCN [id number] ID [id number] GRP [id number] $10 copay card thereafter - BIN [id number] - PCN LOYALTY - ID [id number] - GRP [id number]   04/25/24 1203    diltiazem  (CARDIZEM ) 30 MG tablet        04/25/24 1203    flecainide  (TAMBOCOR ) 50 MG tablet  As needed        04/25/24 1203    metoprolol  succinate (TOPROL -XL) 25 MG 24 hr tablet  Daily        04/25/24 1203               Beverley Leita DELENA DEVONNA 04/25/24 1219    Emil Share, DO 04/25/24 1304

## 2024-04-25 NOTE — ED Triage Notes (Signed)
 Reports palpations and CP since lat night. Hx of afib. No thinners.

## 2024-04-25 NOTE — ED Notes (Signed)
 Took one 50mg  metoprolol  and one 50 mg flecainide  around 1230 this morning.

## 2024-04-25 NOTE — Discharge Instructions (Signed)
 Take Eliquis  as prescribed starting today. I have refilled your metoprolol , Cardizem , flecainide .  Take these as previously prescribed.  Follow-up with cardiology, call in the morning to schedule an appointment. Return to the emergency room for any worsening or concerning symptoms.

## 2024-04-27 ENCOUNTER — Telehealth: Payer: Self-pay | Admitting: Internal Medicine

## 2024-04-27 NOTE — Telephone Encounter (Signed)
 Patient c/o Palpitations:  STAT if patient reporting lightheadedness, shortness of breath, or chest pain  How long have you had palpitations/irregular HR/ Afib? Are you having the symptoms now? Friday night 05/27/23  Are you currently experiencing lightheadedness, SOB or CP? No   Do you have a history of afib (atrial fibrillation) or irregular heart rhythm? Yes   Have you checked your BP or HR? (document readings if available): 58   Are you experiencing any other symptoms? No    Pt would like a c/b from the nurse please advise

## 2024-04-27 NOTE — Telephone Encounter (Signed)
 Experienced SOB slightly (like if he went up a flight of stairs). Pt was laying down in bed when he noticed the fluttering sensation. Hasn't had an episode of afib since February 2024. Pt denies any episodes since Friday. Pt was given rx for Eliquis  5 mg BID. Pt was also given Metoprolol  Succinate 25 mg daily, Flecainide  50 mg PRN with palps/ afib, and Diltiazem  30 mg PRN for HR >100 as long as BP >100. Pt is confused because the Metoprolol  sounds like it should be daily but it also makes it sound like it is for only when symptoms start. He is also unsure of which PRN medication to start with first and the timeframe that should be between taking each medication. Please advise.

## 2024-05-23 NOTE — Progress Notes (Unsigned)
 " Cardiology Office Note:    Date:  05/25/2024   ID:  Grant Knight, DOB October 27, 1960, MRN [id number]  PCP:  [facility] Providers Cardiologist:  Lurena MARLA Red, MD     Referring MD: [facility] [place]   Chief complaint: Follow-up palpitations     History of Present Illness:   Grant Knight is a 64 y.o. male with a hx of atrial fibrillation presenting to the office today for follow-up of recent ED visit.  Patient first presented with palpitations during an ER visit in May 2023, EKG demonstrating regular narrow complex tachycardia at that time.  Vagal maneuvers were attempted, with no improvement in symptoms.  Heart rate slowed with IV fluids, revealing atrial fibrillation with RVR.  Discharge at that time on Toprol  XL, was seen in the office shortly after and in A-fib with a heart rate of 40.  He was instructed to take metoprolol  only as needed.  Patient second episode of atrial fibrillation converted to NSR following a dose of metoprolol .  Repeat ED visit in February 2024 with recurrent palpitations, he converted to sinus rhythm with IV diltiazem .  Most recent office visit with our EP team in March 2024 it was noted the patient preferred an early invasive strategy, and was prescribed flecainide  50 mg as needed for recurrence of atrial fibrillation pending ablation.  Ablation was scheduled for August 2024, patient canceled the ablation with no further follow-up.  Patient arrived to the emergency department 04/25/2024 with further palpitations. Cardioversion was discussed with patient, as blood pressures were in the 90s systolic, patient declined. He was given IV fluids, metoprolol , Cardizem , magnesium  and converted to sinus rhythm.  While rate was slowing down, it appeared patient was in atrial flutter.  He appeared stable for discharge, was recommended close follow-up with cardiology as all of his as needed medications  including metoprolol , flecainide , diltiazem  had expired at home.  He was placed on my schedule for this morning.  Presents independently, doing well from a cardiovascular standpoint.  Reports he is only ever had 4 episodes of atrial fibrillation to his knowledge.  Prior to recent ED visit his last episode was dated February 2024. He denies chest pain, palpitations, dyspnea, orthopnea, n, v,  dark/tarry/bloody stools, hematuria, dizziness, syncope, edema, weight gain.  When these episodes do occur he can generally tell when he feels fatigued and short of breath unprompted, which leads him to checking his pulse and realizing it is irregular.  States he has not followed up with cardiology in a while and that his medications were out of date.  Says he tried to take his metoprolol  and flecainide  with no improvement in symptoms, which prompted the ER visit.  He has not noticed any further episodes of A-fib since his ED visit.  Generally tries to stay active, exercises routinely.   ROS:   Please see the history of present illness.    All other systems reviewed and are negative.     Past Medical History:  Diagnosis Date   Atrial fibrillation (HCC)    Cellulitis of right leg    Effusion of right knee    Lumbar back pain     Past Surgical History:  Procedure Laterality Date   SHOULDER SURGERY      Current Medications: Active Medications[1]   Allergies:   Patient has no known allergies.   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, is married and has son and daughter, no drugs or smoking, drinks socially 3-4 times per week   Social Drivers of Health   Tobacco Use: Low Risk (05/25/2024)   Patient History    Smoking Tobacco Use: Never    Smokeless Tobacco Use: Never    Passive Exposure: Not on file  Financial Resource Strain: Not on file  Food Insecurity: Not on file  Transportation Needs: Not on file  Physical Activity: Not on file  Stress: Not on file  Social Connections: Not on file  Depression (EYV7-0): Not on file  Alcohol Screen: Not on file  Housing: Not on file  Utilities: Not on file  Health Literacy: Not on file     Family History: The patient's family history includes Breast cancer in his sister; Diabetes in his mother and sister; Lung cancer in his mother.  EKGs/Labs/Other Studies Reviewed:    The following studies were reviewed today:  EKG Interpretation Date/Time:  Monday May 25 2024 08:37:32 EST Ventricular Rate:  55 PR Interval:  136 QRS Duration:  94 QT Interval:  450 QTC Calculation: 430 R Axis:   20  Text Interpretation: Sinus bradycardia Inferior infarct , age undetermined Possible Anterior infarct , age undetermined Confirmed by Danaly Bari [phone]) on 05/25/2024 8:44:55 AM    Recent Labs: 04/25/2024: ALT 30; BUN 24; Creatinine, Ser 1.03; Hemoglobin 17.2; Platelets 262; Potassium 4.5; Sodium 138  Recent Lipid Panel No results found for: CHOL, TRIG, HDL, CHOLHDL, VLDL, LDLCALC, LDLDIRECT   Risk Assessment/Calculations:    CHA2DS2-VASc Score = 2   This indicates a 2.2% annual risk of stroke. The patient's score is based upon: CHF History: 0 HTN History: 1 Diabetes History: 0 Stroke History: 0 Vascular Disease History: 0 Age Score: 1 Gender Score: 0     HYPERTENSION CONTROL Vitals:   05/25/24 0833 05/25/24 0920  BP: (!) 148/72 (!) 140/64    The patient's blood pressure is elevated above target today.  In order to address the  patient's elevated BP: Blood pressure will be monitored at home to determine if medication changes need to be made.            Physical Exam:    VS:  BP (!) 140/64 (BP Location: Right Arm)   Pulse (!) 55   Ht 6' 1 (1.854 m)   Wt 230 lb 3.2 oz (104.4 kg)   SpO2 99%   BMI 30.37 kg/m        Wt Readings from Last 3 Encounters:  05/25/24 230 lb 3.2 oz (104.4 kg)  07/30/22 223 lb 9.6 oz (101.4 kg)  06/27/22 222 lb 12.8 oz (101.1 kg)     GEN:  Well nourished, well developed in no acute distress HEENT: Normal NECK: No carotid bruits CARDIAC:  S1-S2 normal, RRR, no murmurs, rubs, gallops RESPIRATORY:  Clear to auscultation without rales, wheezing or rhonchi  MUSCULOSKELETAL:  No edema; No deformity  SKIN: Warm  and dry NEUROLOGIC:  Alert and oriented x 3 PSYCHIATRIC:  Normal affect       Assessment & Plan Paroxysmal atrial fibrillation (HCC) Historical paroxysmal atrial flutter/atrial fibrillation, 4 occurrences reported in total, controlled with as needed medications Ablation discussed and scheduled previously, patient canceled due to infrequency of events EKG: sinus brady, 55 bpm CHA2DS2-VASc score 1, resulting in a 0.6% annual stroke risk.  Patient was hypertensive in office today, states his BP is typically well-controlled at home with diet and exercise.  Discussed with patient that if hypertension continues, this would increase his annual stroke risk to 2.2%, which would require continued Eliquis  use.  Patient is uncertain whether he we will plan to continue Eliquis  at this time, states he will continue to monitor his BP at home and notify us  if readings are regularly >130/80. Advised patient to continue eliquis , and will reach out to primary cardiologist for further recommendations regarding Eliquis  use to report back to patient.  Asymptomatic today. He can generally tell he's in A. Fib/flutter when he feels SOB unprompted. Reports Eliquis  adherence, denies excessive  bleeding/bruising, currently uncertain whether he will continue this. Patient is currently using Toprol -XL as needed for tachycardia, will reach out to primary cardiologist to ask if he feels Lopressor  would be preferred for short acting use. Continue Eliquis  5 mg twice daily Continue diltiazem  30 mg every 4 hours as needed HR >100 as long as BP >100 Continue Toprol -XL 25 mg daily, take at onset of symptoms See EP for refill of flecainide  Therapeutic drug monitoring Restarted Eliquis  following emergency department visit Will check CBC today  Disposition: Follow-up with EP team for continued atrial fibrillation monitoring and possible ablation if necessary going forward.  Follow-up with general cardiology in 1 year or sooner if needed.  Proceed to the ED with any new or worsening symptoms.            Medication Adjustments/Labs and Tests Ordered: Current medicines are reviewed at length with the patient today.  Concerns regarding medicines are outlined above.  Orders Placed This Encounter  Procedures   CBC   Ambulatory referral to Cardiac Electrophysiology   EKG 12-Lead   No orders of the defined types were placed in this encounter.   Patient Instructions  Medication Instructions:  NO CHANGES *If you need a refill on your cardiac medications before your next appointment, please call your pharmacy*  Lab Work: CBC TODAY If you have labs (blood work) drawn today and your tests are completely normal, you will receive your results only by: MyChart Message (if you have MyChart) OR A paper copy in the mail If you have any lab test that is abnormal or we need to change your treatment, we will call you to review the results.  Testing/Procedures: NO TESTING  Follow-Up: At [facility], you and your health needs are our priority.  As part of our continuing mission to provide you with exceptional heart care, our providers are all part of one team.  This team includes your  primary Cardiologist (physician) and Advanced Practice Providers or APPs (Physician Assistants and Nurse Practitioners) who all work together to provide you with the care you need, when you need it.  Your next appointment:   1 year(s)  Provider:   Arun K Thukkani, MD    Other Instructions CONTINUE TO MONITOR BLOOD PRESSURE , GIVE OFFICE A CALL IF BLOOD PRESSURE PERSISTENTLY READS GREATER THAN 130/80   You have been referred to ELECTROPHYSIOLOGY.  Signed, Sriya Kroeze E Jordell Outten, NP  05/25/2024 4:23 PM    [facility]     [1]  Current Meds  Medication Sig   apixaban  (ELIQUIS ) 5 MG TABS tablet Take 1 tablet (5 mg total) by mouth 2 (two) times daily. Start on 04/25/24   diltiazem  (CARDIZEM ) 30 MG tablet Take 1 tablet every 4 hours AS NEEDED for heart rate >100 as long as blood pressure >100.   flecainide  (TAMBOCOR ) 50 MG tablet Take 1 tablet (50 mg total) by mouth as needed (palpitations/episodes of a-fib).   metoprolol  succinate (TOPROL -XL) 25 MG 24 hr tablet Take 1 tablet (25 mg total) by mouth daily. Take at onset of symptoms   "

## 2024-05-25 ENCOUNTER — Encounter: Payer: Self-pay | Admitting: Emergency Medicine

## 2024-05-25 ENCOUNTER — Ambulatory Visit: Admitting: Emergency Medicine

## 2024-05-25 VITALS — BP 140/64 | HR 55 | Ht 73.0 in | Wt 230.2 lb

## 2024-05-25 DIAGNOSIS — Z5181 Encounter for therapeutic drug level monitoring: Secondary | ICD-10-CM | POA: Diagnosis not present

## 2024-05-25 DIAGNOSIS — I480 Paroxysmal atrial fibrillation: Secondary | ICD-10-CM

## 2024-05-25 LAB — CBC
Hematocrit: 43.7 % (ref 37.5–51.0)
Hemoglobin: 14.5 g/dL (ref 13.0–17.7)
MCH: 28.5 pg (ref 26.6–33.0)
MCHC: 33.2 g/dL (ref 31.5–35.7)
MCV: 86 fL (ref 79–97)
Platelets: 220 x10E3/uL (ref 150–450)
RBC: 5.09 x10E6/uL (ref 4.14–5.80)
RDW: 12.8 % (ref 11.6–15.4)
WBC: 9 x10E3/uL (ref 3.4–10.8)

## 2024-05-25 NOTE — Patient Instructions (Signed)
 Medication Instructions:  NO CHANGES *If you need a refill on your cardiac medications before your next appointment, please call your pharmacy*  Lab Work: CBC TODAY If you have labs (blood work) drawn today and your tests are completely normal, you will receive your results only by: MyChart Message (if you have MyChart) OR A paper copy in the mail If you have any lab test that is abnormal or we need to change your treatment, we will call you to review the results.  Testing/Procedures: NO TESTING  Follow-Up: At [facility], you and your health needs are our priority.  As part of our continuing mission to provide you with exceptional heart care, our providers are all part of one team.  This team includes your primary Cardiologist (physician) and Advanced Practice Providers or APPs (Physician Assistants and Nurse Practitioners) who all work together to provide you with the care you need, when you need it.  Your next appointment:   1 year(s)  Provider:   Arun K Thukkani, MD    Other Instructions CONTINUE TO MONITOR BLOOD PRESSURE , GIVE OFFICE A CALL IF BLOOD PRESSURE PERSISTENTLY READS GREATER THAN 130/80   You have been referred to ELECTROPHYSIOLOGY.

## 2024-05-26 ENCOUNTER — Ambulatory Visit: Payer: Self-pay | Admitting: Emergency Medicine

## 2024-05-27 ENCOUNTER — Telehealth: Payer: Self-pay

## 2024-05-27 NOTE — Telephone Encounter (Signed)
 While w/o, patient got foot lodges while using stationary bike. There is lots of bruising on foot now. Is this something he should be concerned about being on blood thinner? No other sx.

## 2024-05-27 NOTE — Telephone Encounter (Signed)
 Patient returned staff call regarding results.

## 2024-05-27 NOTE — Progress Notes (Signed)
 Left message for cb regarding lab results

## 2024-05-29 ENCOUNTER — Telehealth: Payer: Self-pay | Admitting: *Deleted

## 2024-05-29 NOTE — Telephone Encounter (Signed)
 Spoke to patient and let him know that per Dr. Wendel patient should continue his current Eliquis  therapy as discussed with Kenzie Campbell at his last office visit. Given your elevated blood pressure, this raises your annual stroke risk to 2.2%. If you take eliquis , this significantly reduces the risk of having stroke from your atrial fibrillation. With any blood thinners there is the risk of bleeding. For any head injury, any uncontrolled bleeding, or any significant bruising, please seek immediate medical care. Patient understood and had no questions at this time.

## 2024-05-29 NOTE — Telephone Encounter (Signed)
 Left detailed message for pt as verbally permitted by pt
